# Patient Record
Sex: Female | Born: 1995 | Race: Black or African American | Hispanic: No | Marital: Single | Smoking: Never smoker
Health system: Southern US, Community
[De-identification: ages and names within clinical notes are randomized; demographics above are authoritative.]

---
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
## 2013-04-19 ENCOUNTER — Encounter (HOSPITAL_COMMUNITY): Payer: Self-pay | Admitting: Emergency Medicine

## 2013-04-19 ENCOUNTER — Emergency Department (HOSPITAL_COMMUNITY)
Admission: EM | Admit: 2013-04-19 | Discharge: 2013-04-19 | Disposition: A | Discharge status: Home / Self Care | Payer: BC Managed Care – PPO | Attending: Emergency Medicine | Admitting: Emergency Medicine

## 2013-04-19 ENCOUNTER — Emergency Department (HOSPITAL_COMMUNITY): Payer: BC Managed Care – PPO

## 2013-04-19 DIAGNOSIS — Y939 Activity, unspecified: Secondary | ICD-10-CM | POA: Insufficient documentation

## 2013-04-19 DIAGNOSIS — Y929 Unspecified place or not applicable: Secondary | ICD-10-CM | POA: Insufficient documentation

## 2013-04-19 DIAGNOSIS — S93409A Sprain of unspecified ligament of unspecified ankle, initial encounter: Secondary | ICD-10-CM | POA: Insufficient documentation

## 2013-04-19 DIAGNOSIS — X58XXXA Exposure to other specified factors, initial encounter: Secondary | ICD-10-CM | POA: Insufficient documentation

## 2013-04-19 MED ORDER — IBUPROFEN 600 MG PO TABS: 600.0000 mg | ORAL_TABLET | Freq: Four times a day (QID) | ORAL | Status: DC | PRN

## 2013-04-19 NOTE — Discharge Instructions (Signed)
Please call your doctor for a followup appointment within 24-48 hours. When you talk to your doctor please let them know that you were seen in the emergency department and have them acquire all of your records so that they can discuss the findings with you and formulate a treatment plan to fully care for your new and ongoing problems. Please call and set-up appointment with health and wellness Center to be reassessed Please call and set up an appointment with orthopedics regarding ongoing ankle pain Please keep ankle in brace at all times Please rest, ice, elevate Please avoid any physical or shortness activity Please continue to monitor symptoms closely and if symptoms are to worsen or change (fever greater than 101, chills, nausea, vomiting, chest pain, shortness of breath, difficulty breathing, fall, injury) please report back to the ED immediately   Ankle Sprain An ankle sprain is an injury to the strong, fibrous tissues (ligaments) that hold the bones of your ankle joint together.  CAUSES An ankle sprain is usually caused by a fall or by twisting your ankle. Ankle sprains most commonly occur when you step on the outer edge of your foot, and your ankle turns inward. People who participate in sports are more prone to these types of injuries.  SYMPTOMS   Pain in your ankle. The pain may be present at rest or only when you are trying to stand or walk.  Swelling.  Bruising. Bruising may develop immediately or within 1 to 2 days after your injury.  Difficulty standing or walking, particularly when turning corners or changing directions. DIAGNOSIS  Your caregiver will ask you details about your injury and perform a physical exam of your ankle to determine if you have an ankle sprain. During the physical exam, your caregiver will press on and apply pressure to specific areas of your foot and ankle. Your caregiver will try to move your ankle in certain ways. An X-ray exam may be done to be sure a  bone was not broken or a ligament did not separate from one of the bones in your ankle (avulsion fracture).  TREATMENT  Certain types of braces can help stabilize your ankle. Your caregiver can make a recommendation for this. Your caregiver may recommend the use of medicine for pain. If your sprain is severe, your caregiver may refer you to a surgeon who helps to restore function to parts of your skeletal system (orthopedist) or a physical therapist. HOME CARE INSTRUCTIONS   Apply ice to your injury for 1 2 days or as directed by your caregiver. Applying ice helps to reduce inflammation and pain.  Put ice in a plastic bag.  Place a towel between your skin and the bag.  Leave the ice on for 15-20 minutes at a time, every 2 hours while you are awake.  Only take over-the-counter or prescription medicines for pain, discomfort, or fever as directed by your caregiver.  Elevate your injured ankle above the level of your heart as much as possible for 2 3 days.  If your caregiver recommends crutches, use them as instructed. Gradually put weight on the affected ankle. Continue to use crutches or a cane until you can walk without feeling pain in your ankle.  If you have a plaster splint, wear the splint as directed by your caregiver. Do not rest it on anything harder than a pillow for the first 24 hours. Do not put weight on it. Do not get it wet. You may take it off to take a  shower or bath.  You may have been given an elastic bandage to wear around your ankle to provide support. If the elastic bandage is too tight (you have numbness or tingling in your foot or your foot becomes cold and blue), adjust the bandage to make it comfortable.  If you have an air splint, you may blow more air into it or let air out to make it more comfortable. You may take your splint off at night and before taking a shower or bath. Wiggle your toes in the splint several times per day to decrease swelling. SEEK MEDICAL CARE  IF:   You have rapidly increasing bruising or swelling.  Your toes feel extremely cold or you lose feeling in your foot.  Your pain is not relieved with medicine. SEEK IMMEDIATE MEDICAL CARE IF:  Your toes are numb or blue.  You have severe pain that is increasing. MAKE SURE YOU:   Understand these instructions.  Will watch your condition.  Will get help right away if you are not doing well or get worse. Document Released: 01/15/2005 Document Revised: 10/10/2011 Document Reviewed: 01/27/2011 [facility] Patient Information 2014 [place], [place].   Emergency Department Resource Guide 1) Find a Doctor and Pay Out of Pocket Although you won't have to find out who is covered by your insurance plan, it is a good idea to ask around and get recommendations. You will then need to call the office and see if the doctor you have chosen will accept you as a new patient and what types of options they offer for patients who are self-pay. Some doctors offer discounts or will set up payment plans for their patients who do not have insurance, but you will need to ask so you aren't surprised when you get to your appointment.  2) Contact Your Local Health Department Not all health departments have doctors that can see patients for sick visits, but many do, so it is worth a call to see if yours does. If you don't know where your local health department is, you can check in your phone book. The CDC also has a tool to help you locate your state's health department, and many state websites also have listings of all of their local health departments.  3) Find a Walk-in Clinic If your illness is not likely to be very severe or complicated, you may want to try a walk in clinic. These are popping up all over the country in pharmacies, drugstores, and shopping centers. They're usually staffed by nurse practitioners or physician assistants that have been trained to treat common illnesses and complaints. They're  usually fairly quick and inexpensive. However, if you have serious medical issues or chronic medical problems, these are probably not your best option.  No Primary Care Doctor: - Call Health Connect at  [phone] - they can help you locate a primary care doctor that  accepts your insurance, provides certain services, etc. - Physician Referral Service- [phone]  Chronic Pain Problems: Organization         Address  Phone   Notes  [facility]  [phone] Patients need to be referred by their primary care doctor.   Medication Assistance: Organization         Address  Phone   Notes  [facility] Medication [facility] [address] [place] [place], [place] [address] [phone] --Must be a resident of [facility] -- Must have NO insurance coverage whatsoever (no Medicaid/ Medicare, etc.) --  The pt. MUST have a primary care doctor that directs their care regularly and follows them in the community   [facility]ist  [phone]   [employer]  [phone]    Agencies that provide inexpensive medical care: Organization         Address  Phone   Notes  [place]  [phone]   [facility]    [phone]   [facility] [place], [place] [address] [phone]   [facility] [address] [place] [phone]   Planned Parenthood    [phone]   [facility]    [phone]   [place] [address], [place] Phone:  [phone], Fax:  [phone] Hours of Operation:  9 am - 6 pm, M-F.  Also accepts Medicaid/Medicare and self-pay.  [facility] for [place] [place], [address], [place] Phone: [phone], Fax: [phone]. Hours of Operation:  8:30 am - 5:30 pm, M-F.  Also accepts Medicaid and self-pay.  [facility] [place] [address] [place] Phone:  [phone]   [place], [place], [place] [phone], Ext. [phone] Mondays & Thursdays: 7-9 AM.  First 15 patients are seen on a first come, first serve basis.    [place] Providers:  Organization         Address  Phone   Notes  [facility] [address], [place] [phone] Also accepts self-pay patients.  [facility] [id number] [place], [place]  [phone]   [place], [address], [place] [phone]   [facility] [address], [place] [phone]   Lucianne Lei [address], [place]   [phone] Only accepts [place] patients after they have their name applied to their card.   Self-Pay (no insurance) in [facility]:  Organization         Address  Phone   Notes  Sickle Cell Patients, [facility] [place] [phone]   [facility] [place] [phone]   [facility] [place]  [place], [place], [place] [phone]   [facility]/Dr. Osei-Bonsu  [address] [place] or [address], [place] [phone] Phone number for both [place] and [place] locations is the same.  [facility] [address], [place] [phone]   [facility] [address] [place] or [address] [phone]   [facility] [address] [place] [phone], phone; [phone], fax Sees patients 1st and 3rd Saturday of every month.  Must not qualify for public or private insurance (i.e. Medicaid, Medicare, [place] Health Choice, Veterans' Benefits)  Household income should be no more than 200% of the poverty level The clinic cannot treat  you if you are pregnant or think you are pregnant  Sexually transmitted diseases are not treated at the clinic.    Dental Care: Organization         Address  Phone  Notes  [facility] [phone]  [address] [place], [place] [phone] Accepts children up to age 3 who are enrolled in [place] or [place] Health Choice; pregnant women with a Medicaid card; and children who have applied for Medicaid or [place] Health Choice, but were declined, whose parents can pay a reduced fee at time of service.  [facility] Department of [facility]  [address], [place] [phone] Accepts children up to age 100 who are enrolled in [place] or [place] Health Choice; pregnant women with a Medicaid card; and children who have applied for Medicaid or [place] Health Choice, but were declined, whose parents can pay a reduced fee at time of service.  [facility]  [address] [place], [place] [phone] Patients are seen by appointment only. Walk-ins are not accepted. [facility] will see patients 44 years of age and older. Monday - Tuesday (8am-5pm) Most Wednesdays (8:30-5pm) $30 per visit, cash only  [facility] Adult [facility] Access [facility]  [address], [facility] [phone] Patients are seen by appointment only. Walk-ins are not accepted. [facility] will see patients 80 years of age and older. One Wednesday Evening (Monthly: Volunteer Based).  $30 per visit, cash only  [employer]  [phone] for adults; Children under age 10, call Graduate Pediatric Dentistry at [phone]. Children aged 55-14, please call [phone] to request a pediatric application.  Dental services are provided in all areas of dental care including fillings, crowns and bridges, complete and partial dentures, implants, gum treatment, root canals, and extractions. Preventive care is also provided. Treatment  is provided to both adults and children. Patients are selected via a lottery and there is often a waiting list.   [facility] [address] [place]  [phone] [email or web address]   [facility] [address] [place], [place] [phone], Ext. [phone] Second and Fourth Thursday of each month, opens at 6:30 AM; Clinic ends at 9 AM.  Patients are seen on a first-come first-served basis, and a limited number are seen during each clinic.   [facility]  [address] [place], [place] [phone]   Eligibility Requirements You must have lived in [place], [place], or [place] counties for at least the last three months.   You cannot be eligible for state or federal sponsored [place], including [employer], [place], or [employer].   You generally cannot be eligible for healthcare insurance through your employer.    How to apply: Eligibility screenings are held every Tuesday and Wednesday afternoon from 1:00 pm until 4:00 pm. You do not need an appointment for the interview!  [facility] [address] [place], [place] [phone]   [facility] Health Department  [phone]   [facility] Health Department  [phone]   [facility] Health Department  [phone]    Behavioral Health Resources in the Community: Intensive Outpatient Programs Organization         Address  Phone  Notes  [facility] [address], [place], [place] [phone]   [facility] Outpatient [address] [place], [place] [phone]   [facility] [address] [place], [place]  [phone]   [facility] Mental Health [address],  [place], [place] [phone] or [phone]   Substance Abuse Resources Organization         Address  Phone  Notes  Alcohol and  Drug Services  [phone]   [facility]  [phone]   [facility]  [facility]  [phone]   [facility]  [phone]   Residential & Outpatient Substance Abuse Program  [phone]   Psychological Services Organization         Address  Phone  Notes  [facility]  [phone]- [phone]   [facility]  [phone]- [phone]   [facility] [address], [place] [phone] or [phone]    Mobile Crisis Teams Organization         Address  Phone  Notes  [facility], Mobile Crisis Care Unit  [phone]   [facility]  [address] [place], [place] [phone]   Doristine LocksSharon DeEsch [address] [place] [phone]    Self-Help/Support Groups Organization         Address  Phone             Notes  [facility] [place] - variety of support groups  336- [phone] Call for more information  Narcotics Anonymous (NA), [facility] [address], [place]  2 meetings at this location   Statisticianesidential Treatment Programs Organization         Address  Phone  Notes  [facility] [address],    [place]  [phone]   [facility]  [address], [place] [phone], [place], [place] [phone]   [facility] [address], [place] [phone] Admissions: 8am-3pm M-F  [facility] [address]    [place], [place] [phone]   [facility] [address], [place], [place] [phone]   [facility] [address]  [place], [place] [phone]   [facility] - Intensive Outpatient [address], [place], [place] [phone]   [facility] ([facility] [address]  [place], [place] [phone] or [phone]   [facility] ([facility]) [address] [place], [place] [phone] Accepts Medicaid  [facility] [address]  [place] [phone] Substance Abuse/Addiction Treatment     [facility] Behavioral Health Resources Organization         Address  Phone  Notes  [facility]  [phone](888) [phone]   Angie FavaJulie Brannon, PhD [address], [address] [place], [place]   [phone] or [phone](336) [phone]   [facility]   [address] [place], [place] [phone]   [facility] [address], [place], [place] [phone]) [phone] Insurance/Medicaid/sponsorship through [facility]  [facility] [address]                                    [place], [place] [phone] Therapy/tele-psych/case  [facility] [address].   [place], [place] [phone](336) [phone]    Dr. Lolly MustacheArfeen  [phone]) [phone]   [facility]  [employer] [facility] 1) [address], [place] 2) [address], [place] 3)  [address], [place] [phone](336) [phone]  [phone]   [facility] Child Abuse Hotline [phone]) [phone] or [phone](336) [phone] (After Hours)

## 2013-04-19 NOTE — ED Notes (Signed)
She c/o significant pain in left ankle while walking this past Thurs.--this pain persists.  She further tells me she suffered a left ankle fracture some 5 years ago from which she recovered sufficiently to resume normal activity.  She is in no distress.

## 2013-04-19 NOTE — ED Provider Notes (Signed)
CSN: [id number]     Arrival date & time 04/19/13  1211 History  This chart was scribed for non-physician practitioner, Raymon Mutton, PA-C,working with Laray Anger, DO, by Karle Plumber, ED Scribe.  This patient was seen in room WTR7/WTR7 and the patient's care was started at 2:02 PM.  Chief Complaint  Patient presents with  . Ankle Pain   The history is provided by the patient. No language interpreter was used.   HPI Comments:  Yvette Rhodes is a 18 y.o. female who presents to the Emergency Department complaining of sharp pain in the posterior left ankle that started three days ago. She reports associated mild swelling. Pt states the pain is constant but worsens with walking. She states she fractured the ankle approximately six years ago. She reports that the ankle feels weak. She reports wearing an ankle brace. Pt denies taking anything for the pain. She denies warmth to the touch, numbness or tingling of the ankle. Pt denies hearing a popping sound from the ankle. She has been ambulatory since the pain started. Pt states she does have a PCP. She reports seeing an orthopedist at [facility] for her last ankle injury.   History reviewed. No pertinent past medical history. No past surgical history on file. No family history on file. History  Substance Use Topics  . Smoking status: Never Smoker   . Smokeless tobacco: Never Used  . Alcohol Use: No   OB History   Grav Para Term Preterm Abortions TAB SAB Ect Mult Living                 Review of Systems  Musculoskeletal: Positive for arthralgias (left ankle) and joint swelling (left ankle).  Skin: Negative for color change.  Neurological: Negative for numbness.  All other systems reviewed and are negative.   Allergies  Review of patient's allergies indicates no known allergies.  Home Medications   Current Outpatient Rx  Name  Route  Sig  Dispense  Refill  . cyclobenzaprine (FLEXERIL) 10 MG tablet    Oral   Take 1 tablet (10 mg total) by mouth 2 (two) times daily as needed for muscle spasms.   20 tablet   0   . ibuprofen (ADVIL,MOTRIN) 800 MG tablet   Oral   Take 800 mg by mouth every 8 (eight) hours as needed for pain.          Marland Kitchen ibuprofen (ADVIL,MOTRIN) 800 MG tablet   Oral   Take 1 tablet (800 mg total) by mouth 3 (three) times daily.   30 tablet   0    Triage Vitals: BP 120/80  Pulse 87  Temp(Src) 98.3 F (36.8 C) (Oral)  Resp 16  SpO2 100%  LMP 03/28/2013 Physical Exam  Nursing note and vitals reviewed. Constitutional: She is oriented to person, place, and time. She appears well-developed and well-nourished.  HENT:  Head: Normocephalic and atraumatic.  Eyes: Conjunctivae and EOM are normal. Right eye exhibits no discharge. Left eye exhibits no discharge.  Neck: Normal range of motion. Neck supple.  Cardiovascular: Normal rate, regular rhythm and normal heart sounds.  Exam reveals no friction rub.   No murmur heard. Pulmonary/Chest: Effort normal and breath sounds normal. No respiratory distress. She has no wheezes. She has no rales.  Musculoskeletal: Normal range of motion. She exhibits tenderness.       Feet:  Negative swelling, erythema, inflammation, lesions, sores, deformities noted to the left ankle or foot. Discomfort upon palpation to the  posterior aspect at Achilles region. Full range of motion to the left ankle and digits of the left foot without difficulty. Negative pain upon palpation to the foot or digits. Full range of motion to the digits. Negative Thompson sign.  Neurological: She is alert and oriented to person, place, and time. No cranial nerve deficit. She exhibits normal muscle tone. Coordination normal.  Cranial nerves III-XII grossly intact Strength 5+/5+ to upper and lower extremities bilaterally with resistance applied, equal distribution noted Strength intact to the digits of the left foot Sensation intact with differentiation to sharp and  dull touch Gait proper, proper balance - negative sway, negative drift, negative step-offs  Skin: Skin is warm and dry.  Psychiatric: She has a normal mood and affect. Her behavior is normal.    ED Course  Procedures (including critical care time) DIAGNOSTIC STUDIES: Oxygen Saturation is 100% on RA, normal by my interpretation.   COORDINATION OF CARE: 2:07 PM- Will X-Ray left ankle. Pt verbalizes understanding and agrees to plan.  Medications - No data to display  Labs Review Labs Reviewed - No data to display Imaging Review Dg Ankle Complete Left  04/19/2013   CLINICAL DATA:  Posterior left ankle pain, history of fracture 5 years ago  EXAM: LEFT ANKLE COMPLETE - 3+ VIEW  COMPARISON:  CT left lower extremity dated 03/22/2006  FINDINGS: No fracture or dislocation is seen. No residual post-traumatic deformity is present.  The ankle mortise is intact.  The base of the fifth metatarsal is unremarkable.  The visualized soft tissues are unremarkable.  IMPRESSION: Normal ankle radiographs.  No residual post-traumatic deformity is present.   Electronically Signed   By: Charline Bills M.D.   On: 04/19/2013 14:39     EKG Interpretation None      MDM   Final diagnoses:  Ankle sprain    Filed Vitals:   04/19/13 1302  BP: 120/80  Pulse: 87  Temp: 98.3 F (36.8 C)  TempSrc: Oral  Resp: 16  SpO2: 100%    I personally performed the services described in this documentation, which was scribed in my presence. The recorded information has been reviewed and is accurate.  Patient presenting to the ED with left ankle pain does been ongoing since Thursday described as a sharp discomfort worse when patient walks localize the posterior aspect of her left ankle. Stated that she fractured ankle approximately 6 years ago. Stated that she feels a weakness in her left ankle. Denied new or recent injury. Denied numbness, tingling, loss of sensation. Alert and oriented. GCS 15. Heart rate and  rhythm normal. Lungs clear to auscultation to upper and lobes bilaterally. Radial and DP pulses 2+ bilaterally. Negative swelling, erythema, warmth upon palpation, deformities noted to the left ankle or foot. Discomfort upon palpation to the posterior aspect, Achilles tendon region. Full range of motion to the left ankle without difficulty and full range of motion to digits the left foot without difficulty. Full dorsiflexion, plantar flexion, inversion and eversion noted. Strength intact to digits of the left foot. Sensation intact with differentiation sharp and dull touch. Negative Thompson sign. Left ankle plain film negative for acute osseous injury-no residual posterior meds deformity identified. Doubt septic joint. Doubt Achilles tendon rupture. Suspicion to be ankle sprain. Patient stable, afebrile. Patient neurovascularly intact. Discharged patient. Patient placed in ASO brace for comfort. Discussed with patient to rest, ice, elevate. Discussed with patient to avoid physical strenuous activity. Referred patient to primary care provider and orthopedics. Discussed with patient  to closely monitor symptoms and if symptoms are to worsen or change to report back to the ED - strict return instructions given.  Patient agreed to plan of care, understood, all questions answered.   Raymon MuttonMarissa Jebediah Macrae, PA-C 04/19/13 2136

## 2013-04-20 NOTE — ED Provider Notes (Signed)
Medical screening examination/treatment/procedure(s) were performed by non-physician practitioner and as supervising physician I was immediately available for consultation/collaboration.   EKG Interpretation None        [name], DO 04/20/13 2123 

## 2014-04-16 ENCOUNTER — Ambulatory Visit (INDEPENDENT_AMBULATORY_CARE_PROVIDER_SITE_OTHER): Payer: 59 | Admitting: Women's Health

## 2014-04-16 ENCOUNTER — Encounter: Payer: Self-pay | Admitting: Women's Health

## 2014-04-16 VITALS — BP 124/86 | Ht 61.0 in | Wt 127.0 lb

## 2014-04-16 DIAGNOSIS — Z30011 Encounter for initial prescription of contraceptive pills: Secondary | ICD-10-CM

## 2014-04-16 DIAGNOSIS — Z01419 Encounter for gynecological examination (general) (routine) without abnormal findings: Secondary | ICD-10-CM

## 2014-04-16 MED ORDER — NORGESTIMATE-ETH ESTRADIOL 0.25-35 MG-MCG PO TABS: 1.0000 | ORAL_TABLET | Freq: Every day | ORAL | Status: DC

## 2014-04-16 NOTE — Progress Notes (Signed)
Yvette Rhodes Aug 19, 1995 [id number]    History:    Presents for annual exam.  Regular monthly 3 day cycle/ condoms, not currently active. Gardasil series completed. Negative STD screen and normal labs for basic training. 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  Past medical history, past surgical history, family history and social history were all reviewed and documented in the EPIC chart. Student at [employer], physical therapy major, doing well. Mother breast cancer survivor, diagnosed age 50, negative BRCA.  ROS:  A ROS was performed and pertinent positives and negatives are included.  Exam:  Filed Vitals:   04/16/14 1536  BP: 124/86    General appearance:  Normal Thyroid:  Symmetrical, normal in size, without palpable masses or nodularity. Respiratory  Auscultation:  Clear without wheezing or rhonchi Cardiovascular  Auscultation:  Regular rate, without rubs, murmurs or gallops  Edema/varicosities:  Not grossly evident Abdominal  Soft,nontender, without masses, guarding or rebound.  Liver/spleen:  No organomegaly noted  Hernia:  None appreciated  Skin  Inspection:  Grossly normal   Breasts: Examined lying and sitting.     Right: Without masses, retractions, discharge or axillary adenopathy.     Left: Without masses, retractions, discharge or axillary adenopathy. Gentitourinary   Inguinal/mons:  Normal without inguinal adenopathy  External genitalia:  Normal  BUS/Urethra/Skene's glands:  Normal  Vagina:  Normal  Cervix:  Normal  Uterus:   normal in size, shape and contour.  Midline and mobile  Adnexa/parametria:     Rt: Without masses or tenderness.   Lt: Without masses or tenderness.  Anus and perineum: Normal   Assessment/Plan:  19 y.o. 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  Contraception management  Plan: Contraception options  reviewed, prefers pills, Sprintec prescription, proper use given and reviewed slight risk for blood clots and strokes. Will start first day of next cycle take continuously, stopped for 5 days with bleeding. Call if problems with cycle control. SBE's, regular exercise, calcium rich diet, MVI daily encouraged. UA only, Pap screening guidelines reviewed. Condoms encouraged if sexually active. Campus safety reviewed. Will start screening baseline mammograms at 28.  Huel Cote [facility], 4:49 PM 04/16/2014

## 2014-04-16 NOTE — Patient Instructions (Signed)

## 2014-05-12 ENCOUNTER — Other Ambulatory Visit: Payer: Self-pay | Admitting: Women's Health

## 2014-05-12 DIAGNOSIS — Z30011 Encounter for initial prescription of contraceptive pills: Secondary | ICD-10-CM

## 2014-05-12 MED ORDER — NORGESTIMATE-ETH ESTRADIOL 0.25-35 MG-MCG PO TABS: 1.0000 | ORAL_TABLET | Freq: Every day | ORAL | Status: DC

## 2014-11-02 DIAGNOSIS — M79605 Pain in left leg: Secondary | ICD-10-CM | POA: Insufficient documentation

## 2014-12-01 ENCOUNTER — Telehealth: Payer: Self-pay | Admitting: *Deleted

## 2014-12-01 NOTE — Telephone Encounter (Signed)
Pt called and left message in triage voicemail c/o bleeding while on birth control pills since March asked for advice what to do. I called patient back and left on her voicemail to make OV to be seen.

## 2014-12-07 ENCOUNTER — Ambulatory Visit (INDEPENDENT_AMBULATORY_CARE_PROVIDER_SITE_OTHER): Payer: 59 | Admitting: Women's Health

## 2014-12-07 ENCOUNTER — Telehealth: Payer: Self-pay | Admitting: *Deleted

## 2014-12-07 ENCOUNTER — Encounter: Payer: Self-pay | Admitting: Women's Health

## 2014-12-07 VITALS — BP 132/80 | Ht 61.0 in | Wt 127.0 lb

## 2014-12-07 DIAGNOSIS — B9689 Other specified bacterial agents as the cause of diseases classified elsewhere: Secondary | ICD-10-CM

## 2014-12-07 DIAGNOSIS — A499 Bacterial infection, unspecified: Secondary | ICD-10-CM | POA: Diagnosis not present

## 2014-12-07 DIAGNOSIS — N760 Acute vaginitis: Principal | ICD-10-CM

## 2014-12-07 DIAGNOSIS — N926 Irregular menstruation, unspecified: Secondary | ICD-10-CM

## 2014-12-07 LAB — WET PREP FOR TRICH, YEAST, CLUE
TRICH WET PREP: NONE SEEN
WBC, Wet Prep HPF POC: NONE SEEN
YEAST WET PREP: NONE SEEN

## 2014-12-07 MED ORDER — METRONIDAZOLE 500 MG PO TABS: 500.0000 mg | ORAL_TABLET | Freq: Two times a day (BID) | ORAL | Status: DC

## 2014-12-07 NOTE — Progress Notes (Signed)
Patient ID: Yvette Rhodes, female   DOB: [birth year], 19 y.o.   MRN: [id number] Presents with complaint of irregular menstrual bleeding for 1 month. Started on Sprintec in May, taking continuously to avoid menses. Condoms/Same partner. Occasional vaginal odor, denies vaginal itching, urinary symptoms, abdominal pain or fever.   Exam: Appears well. External genitalia within normal limits, speculum exam minimal menses type blood, odor noted, wet prep positive for moderate clues, TNTC bacteria. Bimanual uterus was small, nontender, no fullness in adnexa. GC/Chlamydia culture taken.   Irregular bleeding on continuous OCs Bacteria vaginosis  Plan: Stop OCs for 5 days have cycle and then resume taking. Reviewed mechanism of OCs. Take continuously until spotting/bleeding stopped for 5 days have cycle and then resume. Continue condoms. Flagyl 500 twice daily for 7 days, alcohol precautions reviewed. Instructed to call if continued spotting or irregular bleeding on OCs.

## 2014-12-07 NOTE — Patient Instructions (Signed)

## 2014-12-07 NOTE — Telephone Encounter (Signed)
Rx today for flagyl 500 mg was sent to wrong pharmacy, pt asked if be sent to [facility] in [place]

## 2014-12-09 LAB — GC/CHLAMYDIA PROBE AMP
CT PROBE, AMP APTIMA: NEGATIVE
GC PROBE AMP APTIMA: NEGATIVE

## 2015-04-29 ENCOUNTER — Encounter: Payer: 59 | Admitting: Women's Health

## 2016-05-02 ENCOUNTER — Encounter: Payer: Self-pay | Admitting: Women's Health

## 2016-05-02 ENCOUNTER — Ambulatory Visit (INDEPENDENT_AMBULATORY_CARE_PROVIDER_SITE_OTHER): Payer: 59 | Admitting: Women's Health

## 2016-05-02 VITALS — BP 112/70

## 2016-05-02 DIAGNOSIS — B373 Candidiasis of vulva and vagina: Secondary | ICD-10-CM

## 2016-05-02 DIAGNOSIS — B9689 Other specified bacterial agents as the cause of diseases classified elsewhere: Secondary | ICD-10-CM

## 2016-05-02 DIAGNOSIS — N760 Acute vaginitis: Secondary | ICD-10-CM

## 2016-05-02 DIAGNOSIS — B3731 Acute candidiasis of vulva and vagina: Secondary | ICD-10-CM

## 2016-05-02 LAB — WET PREP FOR TRICH, YEAST, CLUE: TRICH WET PREP: NONE SEEN

## 2016-05-02 MED ORDER — FLUCONAZOLE 150 MG PO TABS: 150.0000 mg | ORAL_TABLET | Freq: Once | ORAL | 1 refills | Status: DC

## 2016-05-02 MED ORDER — METRONIDAZOLE 500 MG PO TABS: 500.0000 mg | ORAL_TABLET | Freq: Two times a day (BID) | ORAL | 0 refills | Status: DC

## 2016-05-02 MED ORDER — FLUCONAZOLE 150 MG PO TABS: 150.0000 mg | ORAL_TABLET | Freq: Once | ORAL | 1 refills | Status: AC

## 2016-05-02 NOTE — Patient Instructions (Signed)
Vaginal Yeast infection, Adult Vaginal yeast infection is a condition that causes soreness, swelling, and redness (inflammation) of the vagina. It also causes vaginal discharge. This is a common condition. Some women get this infection frequently. What are the causes? This condition is caused by a change in the normal balance of the yeast (candida) and bacteria that live in the vagina. This change causes an overgrowth of yeast, which causes the inflammation. What increases the risk? This condition is more likely to develop in:  Women who take antibiotic medicines.  Women who have diabetes.  Women who take birth control pills.  Women who are pregnant.  Women who douche often.  Women who have a weak defense (immune) system.  Women who have been taking steroid medicines for a long time.  Women who frequently wear tight clothing. What are the signs or symptoms? Symptoms of this condition include:  White, thick vaginal discharge.  Swelling, itching, redness, and irritation of the vagina. The lips of the vagina (vulva) may be affected as well.  Pain or a burning feeling while urinating.  Pain during sex. How is this diagnosed? This condition is diagnosed with a medical history and physical exam. This will include a pelvic exam. Your health care provider will examine a sample of your vaginal discharge under a microscope. Your health care provider may send this sample for testing to confirm the diagnosis. How is this treated? This condition is treated with medicine. Medicines may be over-the-counter or prescription. You may be told to use one or more of the following:  Medicine that is taken orally.  Medicine that is applied as a cream.  Medicine that is inserted directly into the vagina (suppository). Follow these instructions at home:  Take or apply over-the-counter and prescription medicines only as told by your health care provider.  Do not have sex until your health care  provider has approved. Tell your sex partner that you have a yeast infection. That person should go to his or her health care provider if he or she develops symptoms.  Do not wear tight clothes, such as pantyhose or tight pants.  Avoid using tampons until your health care provider approves.  Eat more yogurt. This may help to keep your yeast infection from returning.  Try taking a sitz bath to help with discomfort. This is a warm water bath that is taken while you are sitting down. The water should only come up to your hips and should cover your buttocks. Do this 3-4 times per day or as told by your health care provider.  Do not douche.  Wear breathable, cotton underwear.  If you have diabetes, keep your blood sugar levels under control. Contact a health care provider if:  You have a fever.  Your symptoms go away and then return.  Your symptoms do not get better with treatment.  Your symptoms get worse.  You have new symptoms.  You develop blisters in or around your vagina.  You have blood coming from your vagina and it is not your menstrual period.  You develop pain in your abdomen. This information is not intended to replace advice given to you by your health care provider. Make sure you discuss any questions you have with your health care provider. Document Released: 10/25/2004 Document Revised: 06/29/2015 Document Reviewed: 07/19/2014 Elsevier Interactive Patient Education  2017 Elsevier Inc. Bacterial Vaginosis Bacterial vaginosis is an infection of the vagina. It happens when too many germs (bacteria) grow in the vagina. This infection puts   you at risk for infections from sex (STIs). Treating this infection can lower your risk for some STIs. You should also treat this if you are pregnant. It can cause your baby to be born early. Follow these instructions at home: Medicines   Take over-the-counter and prescription medicines only as told by your doctor.  Take or use your  antibiotic medicine as told by your doctor. Do not stop taking or using it even if you start to feel better. General instructions   If you your sexual partner is a woman, tell her that you have this infection. She needs to get treatment if she has symptoms. If you have a female partner, he does not need to be treated.  During treatment:  Avoid sex.  Do not douche.  Avoid alcohol as told.  Avoid breastfeeding as told.  Drink enough fluid to keep your pee (urine) clear or pale yellow.  Keep your vagina and butt (rectum) clean.  Wash the area with warm water every day.  Wipe from front to back after you use the toilet.  Keep all follow-up visits as told by your doctor. This is important. Preventing this condition   Do not douche.  Use only warm water to wash around your vagina.  Use protection when you have sex. This includes:  Latex condoms.  Dental dams.  Limit how many people you have sex with. It is best to only have sex with the same person (be monogamous).  Get tested for STIs. Have your partner get tested.  Wear underwear that is cotton or lined with cotton.  Avoid tight pants and pantyhose. This is most important in summer.  Do not use any products that have nicotine or tobacco in them. These include cigarettes and e-cigarettes. If you need help quitting, ask your doctor.  Do not use illegal drugs.  Limit how much alcohol you drink. Contact a doctor if:  Your symptoms do not get better, even after you are treated.  You have more discharge or pain when you pee (urinate).  You have a fever.  You have pain in your belly (abdomen).  You have pain with sex.  Your bleed from your vagina between periods. Summary  This infection happens when too many germs (bacteria) grow in the vagina.  Treating this condition can lower your risk for some infections from sex (STIs).  You should also treat this if you are pregnant. It can cause early (premature)  birth.  Do not stop taking or using your antibiotic medicine even if you start to feel better. This information is not intended to replace advice given to you by your health care provider. Make sure you discuss any questions you have with your health care provider. Document Released: 10/25/2007 Document Revised: 10/01/2015 Document Reviewed: 10/01/2015 Elsevier Interactive Patient Education  2017 Elsevier Inc.  

## 2016-05-02 NOTE — Progress Notes (Signed)
HPI: Presents with cramping around normal time of menustration as well as intermittent cramping. 1/10 severity. On first pack of Yaz just began placebo pill. Denies urinary symptoms, fever Sexually active with same partner 3 years. STD testing offered and denied. Spironolactone 100 mg for acne per dermotologist but stopped taking due to dizziness and BP decrease.   Exam: Appears well, in no acute distress. External genitalia within normal limits, no visible erythema, Speculum exam noted for white discharge, mild odor. Pelvic exam normal with no adnexal discomfort. No CVA tenderness. Wet prep: Many yeast, moderate clue cells, TNTC wbc's, many bacteria  Bacterial Vaginosis Candidiasis  Plan: Diflucan  1 tablet, one time. Flagyl 500 mg twice a day for 7 days, alcohol prevention reviewed. Decrease Spironolactone to 1/2 tablet,  for acne. Explained cramping likely due to lack of menstuation/bacterial infection. Discussed prevention for yeast infections such as frequent underwear change and sleeping without underwear. Encouraged continual OCP and condom use. Call office if no improvement.

## 2016-06-13 ENCOUNTER — Encounter: Payer: Self-pay | Admitting: Gynecology

## 2017-05-06 ENCOUNTER — Encounter: Payer: 59 | Admitting: Women's Health

## 2017-07-16 ENCOUNTER — Encounter: Payer: Self-pay | Admitting: Women's Health

## 2017-07-16 ENCOUNTER — Ambulatory Visit: Payer: Managed Care, Other (non HMO) | Admitting: Women's Health

## 2017-07-16 VITALS — BP 118/78 | Ht 61.0 in | Wt 124.0 lb

## 2017-07-16 DIAGNOSIS — Z01419 Encounter for gynecological examination (general) (routine) without abnormal findings: Secondary | ICD-10-CM

## 2017-07-16 MED ORDER — DROSPIRENONE-ETHINYL ESTRADIOL 3-0.02 MG PO TABS: 1.0000 | ORAL_TABLET | Freq: Every day | ORAL | 4 refills | Status: DC

## 2017-07-16 NOTE — Patient Instructions (Signed)

## 2017-07-16 NOTE — Progress Notes (Signed)
Yvette Rhodes [birth year] [id number]    History:    Presents for annual exam. Regular monthly 3 day cycles with migraines. Sprintec stopped due to daily menstrual bleeding. Sexually active with same partner with no contraceptive use. LMP 07/14/2017. Gardasil series completed.  Reports 1 normal Pap done elsewhere.  Past medical history, past surgical history, family history and social history were all reviewed and documented in the EPIC chart.Currently works at [employer] in [place]. Exercise/sports undergraduate degree from [employer]. Intends to become a physical therapists. Recently discharged from [employer] after stress fracture injury. 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 Same partner for 5 years. Mother breast cancer survivor diagnosed at age 24 with indeterminate BRCA.  ROS:  A ROS was performed and pertinent positives and negatives are included.   Exam:  Vitals:   07/16/17 1608  BP: 118/78  Weight: 124 lb (56.2 kg)  Height: '5\' 1"'$  (1.549 m)   Body mass index is 23.43 kg/m.   General appearance:  Normal Thyroid:  Symmetrical, normal in size, without palpable masses or nodularity. Respiratory  Auscultation:  Clear without wheezing or rhonchi Cardiovascular  Auscultation:  Regular rate, without rubs, murmurs or gallops  Edema/varicosities:  Not grossly evident Abdominal  Soft,nontender, without masses, guarding or rebound.  Liver/spleen:  No organomegaly noted  Hernia:  None appreciated  Skin  Inspection:  Grossly normal   Breasts: Examined lying and sitting.     Right: Without masses, retractions, discharge or axillary adenopathy.     Left: Without masses, retractions, discharge or axillary adenopathy. Gentitourinary   Inguinal/mons:  Normal without inguinal adenopathy  External genitalia:  Normal  BUS/Urethra/Skene's glands:  Normal  Vagina:  Normal, menses bleeding  Cervix:  Normal  Uterus:  Normal in size, shape and contour.  Midline and  mobile  Adnexa/parametria:     Rt: Without masses or tenderness.   Lt: Without masses or tenderness.  Anus and perineum: Normal   Assessment/Plan:  22 y.o. SBF G0 for annual exam no complaints.  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 Prescription for Yaz 3-0.02 mg tablet daily. Proper use given and reviewed slight risk for blood clot and strokes. Will start today, placebo days only 4 then start next pack to manage menstrual migraines. Condoms encouraged for first week. Discussed screening mammograms at age 22. Pap 2016 negative. Discussed bacterial vaginosis prevention upon request. CBC . Pap  Yvette Rhodes, 4:51 PM 07/16/2017

## 2017-07-20 LAB — PAP IG, CT-NG, RFX HPV ASCU
CHLAMYDIA, NUC. ACID AMP: NEGATIVE
Gonococcus by Nucleic Acid Amp: NEGATIVE
PAP SMEAR COMMENT: 0

## 2017-07-23 ENCOUNTER — Encounter: Payer: Self-pay | Admitting: Women's Health

## 2017-09-09 ENCOUNTER — Telehealth: Payer: Self-pay | Admitting: *Deleted

## 2017-09-09 MED ORDER — DROSPIRENONE-ETHINYL ESTRADIOL 3-0.02 MG PO TABS: 1.0000 | ORAL_TABLET | Freq: Every day | ORAL | 4 refills | Status: DC

## 2017-09-09 NOTE — Telephone Encounter (Signed)
Patient called stating birth control pills was not at [facility] Pt aware.

## 2018-01-25 ENCOUNTER — Other Ambulatory Visit: Payer: Self-pay

## 2018-01-25 ENCOUNTER — Encounter (HOSPITAL_COMMUNITY): Payer: Self-pay

## 2018-01-25 ENCOUNTER — Emergency Department (HOSPITAL_COMMUNITY)
Admission: EM | Admit: 2018-01-25 | Discharge: 2018-01-26 | Disposition: A | Discharge status: Home / Self Care | Payer: Managed Care, Other (non HMO) | Attending: Emergency Medicine | Admitting: Emergency Medicine

## 2018-01-25 DIAGNOSIS — Y929 Unspecified place or not applicable: Secondary | ICD-10-CM | POA: Diagnosis not present

## 2018-01-25 DIAGNOSIS — Y999 Unspecified external cause status: Secondary | ICD-10-CM | POA: Insufficient documentation

## 2018-01-25 DIAGNOSIS — Y9389 Activity, other specified: Secondary | ICD-10-CM | POA: Insufficient documentation

## 2018-01-25 DIAGNOSIS — S0990XA Unspecified injury of head, initial encounter: Secondary | ICD-10-CM | POA: Diagnosis present

## 2018-01-25 DIAGNOSIS — W208XXA Other cause of strike by thrown, projected or falling object, initial encounter: Secondary | ICD-10-CM | POA: Diagnosis not present

## 2018-01-25 DIAGNOSIS — Z79899 Other long term (current) drug therapy: Secondary | ICD-10-CM | POA: Insufficient documentation

## 2018-01-25 DIAGNOSIS — S060X0A Concussion without loss of consciousness, initial encounter: Secondary | ICD-10-CM | POA: Insufficient documentation

## 2018-01-25 MED ORDER — KETOROLAC TROMETHAMINE 60 MG/2ML IM SOLN
60.0000 mg | Freq: Once | INTRAMUSCULAR | Status: AC
  Administered 2018-01-26: 60 mg via INTRAMUSCULAR
  Filled 2018-01-25: qty 2

## 2018-01-25 NOTE — ED Triage Notes (Signed)
Patient reports that her car trunk hit the top of her head yesterday and is now having headache, worsening pain when reading, and sensitivity to light. Patient denies any N/V or blurred vision

## 2018-01-25 NOTE — ED Provider Notes (Signed)
[facility]-EMERGENCY DEPT Provider Note   CSN: [id number] Arrival date & time: 01/25/18  1606     History   Chief Complaint Chief Complaint  Patient presents with  . Headache    HPI Yvette Rhodes is a 22 y.o. female.  HPI Patient presents to the emergency department with a headache following getting hit in the head with her trunk.  The patient states that she was doing something in her trunk when the trunk fell and hit her in the top of the head.  The patient states that she did not lose consciousness.  She she is has headache and some light sensitivity along with mild nausea.  Patient states that she has no other symptoms at this time.  Patient states she did not take any medications prior to arrival for her symptoms.  Patient denies vomiting, chest pain, shortness of breath, neck pain, dizziness, weakness, numbness, back pain, fever or syncope. History reviewed. No pertinent past medical history.  There are no active problems to display for this patient.   History reviewed. No pertinent surgical history.   OB History    Gravida  0   Para  0   Term  0   Preterm  0   AB  0   Living  0     SAB  0   TAB  0   Ectopic  0   Multiple  0   Live Births               Home Medications    Prior to Admission medications   Medication Sig Start Date End Date Taking? Authorizing Provider  drospirenone-ethinyl estradiol (YAZ,GIANVI,LORYNA) 3-0.02 MG tablet Take 1 tablet by mouth daily. 09/09/17   Harrington ChallengerYoung, Nancy J, NP    Family History Family History  Problem Relation Age of Onset  . Breast cancer Mother   . Hypertension Mother     Social History Social History   Tobacco Use  . Smoking status: Never Smoker  . Smokeless tobacco: Never Used  Substance Use Topics  . Alcohol use: No    Alcohol/week: 0.0 standard drinks  . Drug use: No     Allergies   Patient has no known allergies.   Review of Systems Review of  Systems All other systems negative except as documented in the HPI. All pertinent positives and negatives as reviewed in the HPI.  Physical Exam Updated Vital Signs BP 116/81 (BP Location: Left Arm)   Pulse 82   Temp 98.5 F (36.9 C) (Oral)   Resp 18   Ht 5\' 1"  (1.549 m)   Wt 53.1 kg   LMP 12/26/2017   SpO2 100%   BMI 22.11 kg/m   Physical Exam Vitals signs and nursing note reviewed.  Constitutional:      General: She is not in acute distress.    Appearance: She is well-developed.  HENT:     Head: Normocephalic and atraumatic.  Eyes:     Pupils: Pupils are equal, round, and reactive to light.  Neck:     Musculoskeletal: Normal range of motion and neck supple.  Cardiovascular:     Rate and Rhythm: Normal rate and regular rhythm.     Heart sounds: Normal heart sounds. No murmur. No friction rub. No gallop.   Pulmonary:     Effort: Pulmonary effort is normal. No respiratory distress.     Breath sounds: Normal breath sounds. No wheezing.  Skin:    General: Skin is  warm and dry.     Capillary Refill: Capillary refill takes less than 2 seconds.     Findings: No erythema or rash.  Neurological:     Mental Status: She is alert and oriented to person, place, and time. Mental status is at baseline.     GCS: GCS eye subscore is 4. GCS verbal subscore is 5. GCS motor subscore is 6.     Sensory: No sensory deficit.     Motor: No weakness or abnormal muscle tone.     Coordination: Coordination normal.     Gait: Gait normal.  Psychiatric:        Mood and Affect: Mood normal.        Behavior: Behavior normal.      ED Treatments / Results  Labs (all labs ordered are listed, but only abnormal results are displayed) Labs Reviewed - No data to display  EKG None  Radiology No results found.  Procedures Procedures (including critical care time)  Medications Ordered in ED Medications - No data to display   Initial Impression / Assessment and Plan / ED Course  I have  reviewed the triage vital signs and the nursing notes.  Pertinent labs & imaging results that were available during my care of the patient were reviewed by me and considered in my medical decision making (see chart for details).     Patient and I had a discussion about CT scan versus observing for worsening in her symptoms.  The patient states she does not feel she needs a CT scan at this time.  On examination she has normal strength and sensation in all 4 extremities.  The patient has normal heel-to-shin and finger-nose testing.  Patient has normal gait.  The patient will be given medication for her headache and advised to return for any worsening in her condition.  The patient agrees to this plan and all questions were answered.  The patient states that he does not feel her symptoms necessarily warrant a CT scan at this time.  I did educate the patient on postconcussive symptoms.  Final Clinical Impressions(s) / ED Diagnoses   Final diagnoses:  None    ED Discharge Orders    None       Charlestine NightLawyer, Rose Hegner, PA-C 01/25/18 [id number]    Pricilla LovelessGoldston, Scott, MD 01/26/18 [phone]

## 2018-01-26 MED ORDER — IBUPROFEN 800 MG PO TABS: 800.0000 mg | ORAL_TABLET | Freq: Three times a day (TID) | ORAL | 0 refills | Status: DC | PRN

## 2018-01-26 NOTE — Discharge Instructions (Addendum)
Return here as needed.  Follow-up with primary doctor.  Increase your fluid intake. °

## 2018-02-10 ENCOUNTER — Encounter (HOSPITAL_COMMUNITY): Payer: Self-pay | Admitting: *Deleted

## 2018-02-10 ENCOUNTER — Emergency Department (HOSPITAL_COMMUNITY): Payer: Managed Care, Other (non HMO)

## 2018-02-10 ENCOUNTER — Emergency Department (HOSPITAL_COMMUNITY)
Admission: EM | Admit: 2018-02-10 | Discharge: 2018-02-10 | Disposition: A | Discharge status: Home / Self Care | Payer: Managed Care, Other (non HMO) | Attending: Emergency Medicine | Admitting: Emergency Medicine

## 2018-02-10 DIAGNOSIS — W208XXD Other cause of strike by thrown, projected or falling object, subsequent encounter: Secondary | ICD-10-CM | POA: Insufficient documentation

## 2018-02-10 DIAGNOSIS — R51 Headache: Secondary | ICD-10-CM | POA: Diagnosis present

## 2018-02-10 DIAGNOSIS — Z79899 Other long term (current) drug therapy: Secondary | ICD-10-CM | POA: Diagnosis not present

## 2018-02-10 DIAGNOSIS — S060X0D Concussion without loss of consciousness, subsequent encounter: Secondary | ICD-10-CM | POA: Insufficient documentation

## 2018-02-10 DIAGNOSIS — R519 Headache, unspecified: Secondary | ICD-10-CM

## 2018-02-10 MED ORDER — KETOROLAC TROMETHAMINE 30 MG/ML IJ SOLN
30.0000 mg | Freq: Once | INTRAMUSCULAR | Status: AC
Start: 2018-02-10 — End: 2018-02-10
  Administered 2018-02-10: 30 mg via INTRAMUSCULAR
  Filled 2018-02-10: qty 1

## 2018-02-10 MED ORDER — ACETAMINOPHEN 325 MG PO TABS
650.0000 mg | ORAL_TABLET | Freq: Once | ORAL | Status: AC
Start: 2018-02-10 — End: 2018-02-10
  Administered 2018-02-10: 650 mg via ORAL
  Filled 2018-02-10: qty 2

## 2018-02-10 NOTE — ED Notes (Signed)
ED Provider at bedside. 

## 2018-02-10 NOTE — ED Notes (Signed)
Pt updated on wait for CT, CT states she is next

## 2018-02-10 NOTE — ED Triage Notes (Signed)
Pt in c/o pulsating headache above her left eye, states this has been happening intermittently since a concussion a few weeks ago, denies vision changes, denies n/v

## 2018-02-10 NOTE — Discharge Instructions (Addendum)
Please read attached information. If you experience any new or worsening signs or symptoms please return to the emergency room for evaluation. Please follow-up with your primary care provider or specialist as discussed. Please use medication prescribed only as directed and discontinue taking if you have any concerning signs or symptoms.   °
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
## 2018-02-11 ENCOUNTER — Ambulatory Visit: Payer: Managed Care, Other (non HMO) | Admitting: Neurology

## 2018-02-11 ENCOUNTER — Encounter: Payer: Self-pay | Admitting: Neurology

## 2018-02-11 VITALS — BP 121/78 | HR 94 | Ht 61.0 in | Wt 118.0 lb

## 2018-02-11 DIAGNOSIS — G44319 Acute post-traumatic headache, not intractable: Secondary | ICD-10-CM
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
## 2018-07-21 ENCOUNTER — Encounter: Payer: Managed Care, Other (non HMO) | Admitting: Women's Health

## 2020-01-30 NOTE — L&D Delivery Note (Signed)
Delivery Note   Patient Name: Yvette Rhodes DOB: 06/11/1995 MRN: [id number]  Date of admission: 08/31/2020 Delivering MD:  Dale Renfrow, CNM Date of delivery: 09/02/20 Type of delivery: SVD  Newborn Data: Live born female  Birth Weight:   APGAR:9, 9   Newborn Delivery   Birth date/time: 09/[birth year]/2020 02:09:00 Delivery type: Vaginal, Spontaneous     [name], 25 y.o., @ [redacted]w[redacted]d,  G1P0000, who was admitted for 8/4 for IOL for IUGR, progressed with cytotex x2, pitocin, AROM, gbs-.. I was called to the room when she progressed 2+ station in the second stage of labor.  She pushed for 5/min.  She delivered a viable infant, cephalic and restituted to the LOA position over an intact perineum.  A nuchal cord   was not identified. The baby was placed on maternal abdomen while initial step of NRP were perfmored (Dry, Stimulated, and warmed). Hat placed on baby for thermoregulation. Delayed cord clamping was performed for 2 minutes.  Cord double clamped and cut.  Cord cut by father. Apgar scores were 9 and 9. Prophylactic Pitocin was started in the third stage of labor for active management. The placenta delivered spontaneously, shultz, with a 3 vessel cord and was sent to pathology.  Inspection revealed 1st degree and labial. An examination of the vaginal vault and cervix was free from lacerations. The uterus was firm, bleeding stable.   Umbilical artery blood gas were not sent.  There were no complications during the procedure.  Mom and baby skin to skin following delivery. Left in stable condition.  Maternal Info: Anesthesia: Epidural Episiotomy: no Lacerations:  1st degree and left labial hemostatic no repiar required Suture Repair: no Est. Blood Loss (mL):   Newborn Info:  Baby Sex: female Circumcision: N/A  APGAR (1 MIN):   APGAR (5 MINS):   APGAR (10 MINS):     Mom to postpartum.  Baby to Couplet care / Skin to Skin.   [place], [place], NP-C 09/02/20 2:28  AM

## 2020-02-17 LAB — OB RESULTS CONSOLE GC/CHLAMYDIA
Chlamydia: NEGATIVE
Gonorrhea: NEGATIVE

## 2020-02-17 LAB — OB RESULTS CONSOLE RUBELLA ANTIBODY, IGM: Rubella: IMMUNE

## 2020-02-17 LAB — OB RESULTS CONSOLE ABO/RH: RH Type: POSITIVE

## 2020-02-17 LAB — OB RESULTS CONSOLE HEPATITIS B SURFACE ANTIGEN: Hepatitis B Surface Ag: NEGATIVE

## 2020-02-17 LAB — OB RESULTS CONSOLE HIV ANTIBODY (ROUTINE TESTING): HIV: NONREACTIVE

## 2020-02-17 LAB — OB RESULTS CONSOLE ANTIBODY SCREEN: Antibody Screen: NEGATIVE

## 2020-02-17 LAB — OB RESULTS CONSOLE RPR: RPR: NONREACTIVE

## 2020-08-09 ENCOUNTER — Other Ambulatory Visit: Payer: Self-pay | Admitting: Obstetrics and Gynecology

## 2020-08-23 ENCOUNTER — Telehealth (HOSPITAL_COMMUNITY): Payer: Self-pay | Admitting: *Deleted

## 2020-08-23 NOTE — Telephone Encounter (Signed)
Preadmission screen  

## 2020-08-24 ENCOUNTER — Other Ambulatory Visit: Payer: Self-pay | Admitting: Advanced Practice Midwife

## 2020-08-25 ENCOUNTER — Encounter (HOSPITAL_COMMUNITY): Payer: Self-pay | Admitting: *Deleted

## 2020-08-25 ENCOUNTER — Telehealth (HOSPITAL_COMMUNITY): Payer: Self-pay | Admitting: *Deleted

## 2020-08-25 NOTE — Telephone Encounter (Signed)
Preadmission screen  

## 2020-08-28 IMAGING — CT CT HEAD W/O CM
4 series · 16 of 47 positions shown, 18 images · non-contrast
Comparison: None.

CLINICAL DATA: Right frontal headache.  Tearing from the right eye.

EXAM:
CT HEAD WITHOUT CONTRAST
TECHNIQUE: Contiguous axial images were obtained from the base of the skull
through the vertex without intravenous contrast.

[Series 3: head without · axial · non-contrast · 0.41mm/px · z∈[-63,+57]mm · 7 of 33 slices shown, 9 images]
[im 5/33  brain]
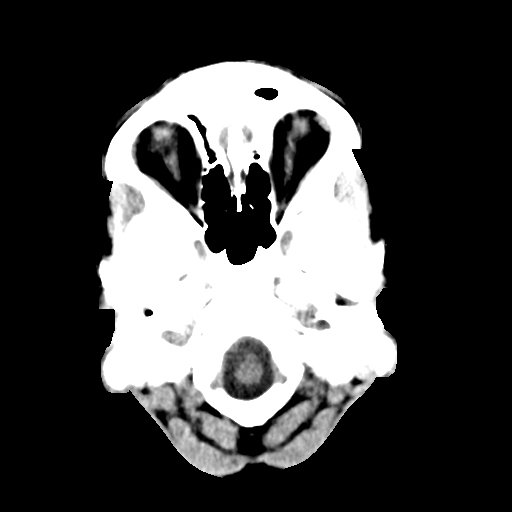
[im 5/33  bone]
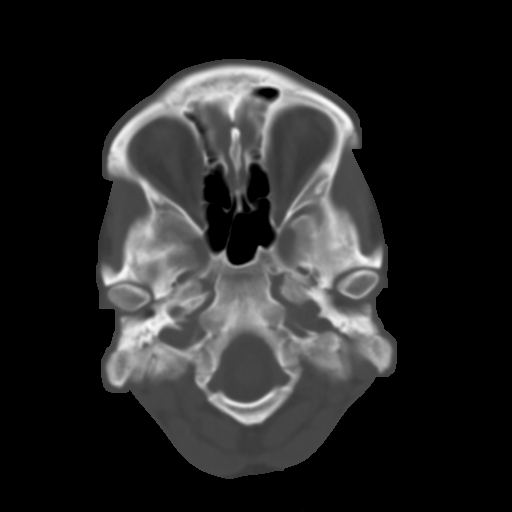
[im 9/33  brain]
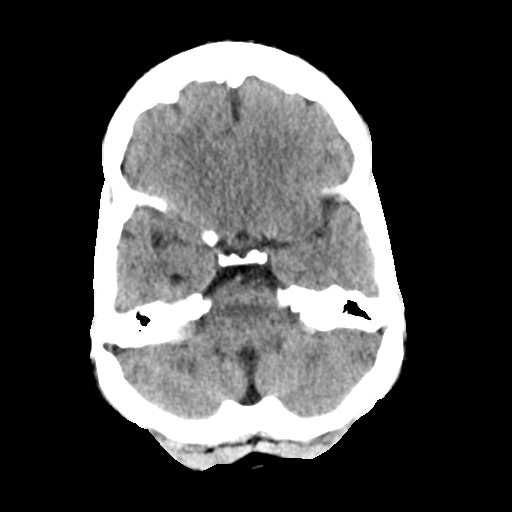
[im 13/33  brain]
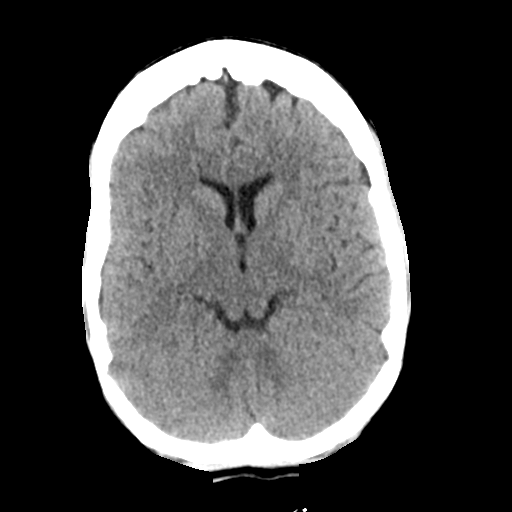
[im 17/33  brain]
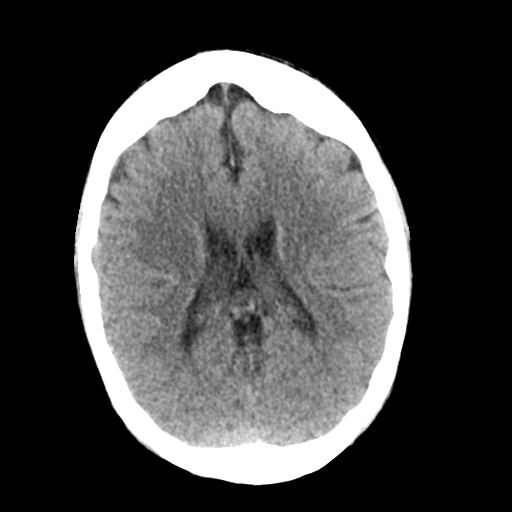
[im 21/33  brain]
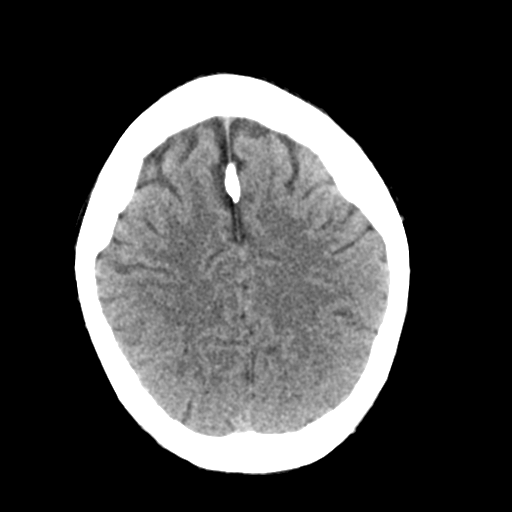
[im 21/33  bone]
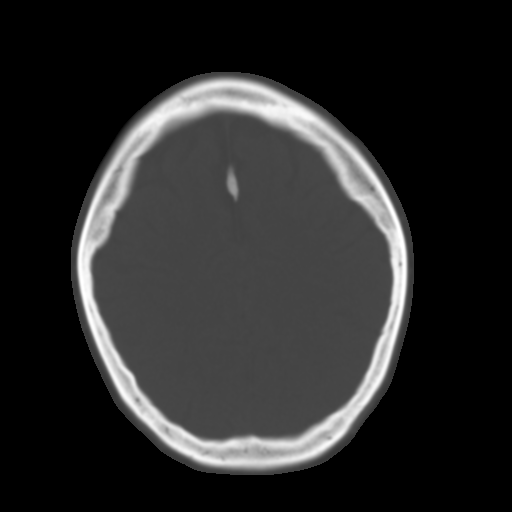
[im 25/33  brain]
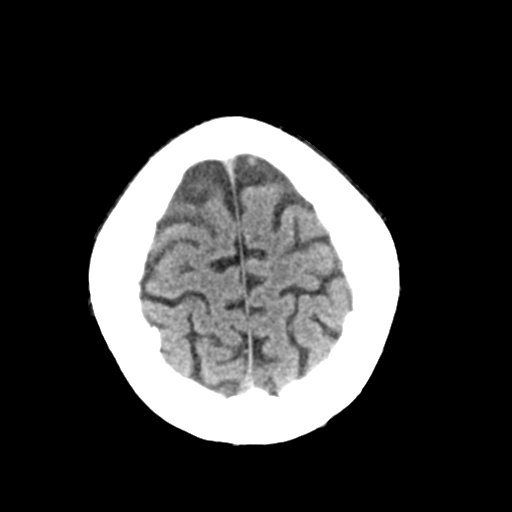
[im 29/33  brain]
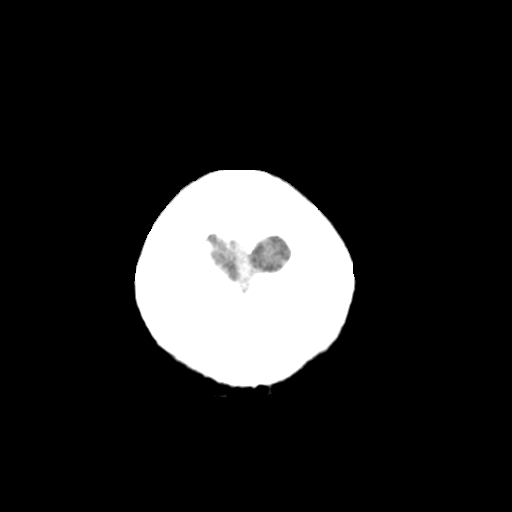

[Series 4: head bone · axial · 0.41mm/px · z∈[-67,-35]mm · 3 of 82 slices shown]
[im 9/82  bone]
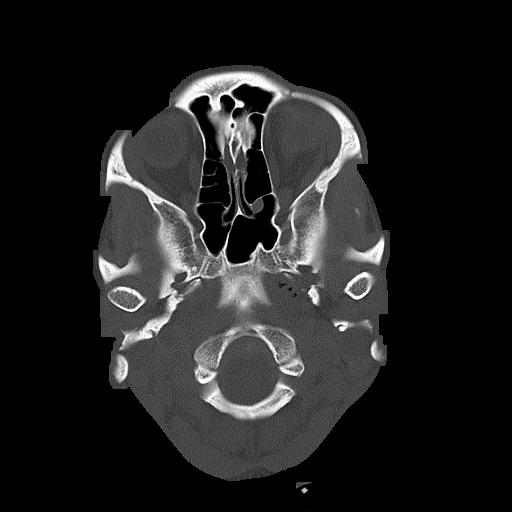
[im 17/82  bone]
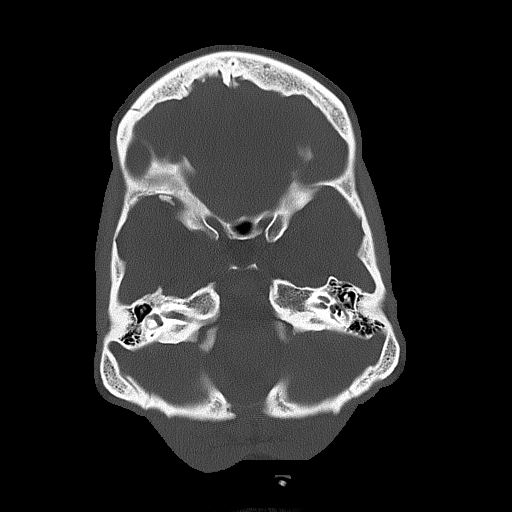
[im 25/82  bone]
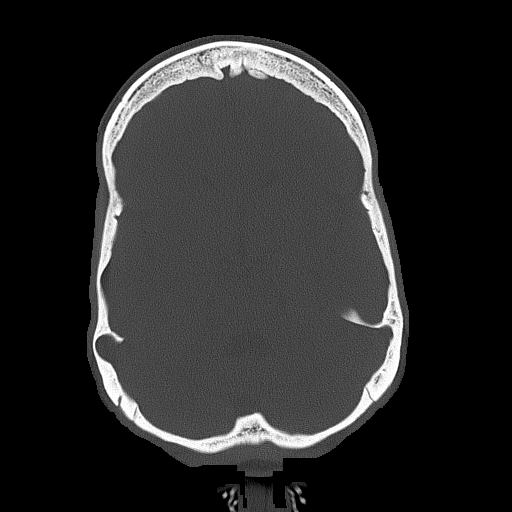

[Series 5: head without cor · coronal · non-contrast · 0.35mm/px · 3 of 66 slices shown]
[im 22/66  brain]
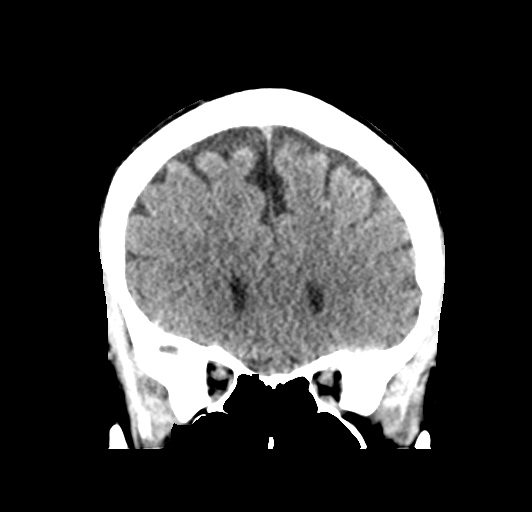
[im 29/66  brain]
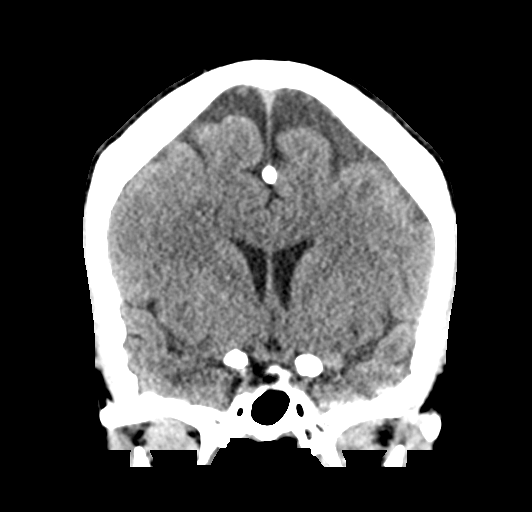
[im 37/66  brain]
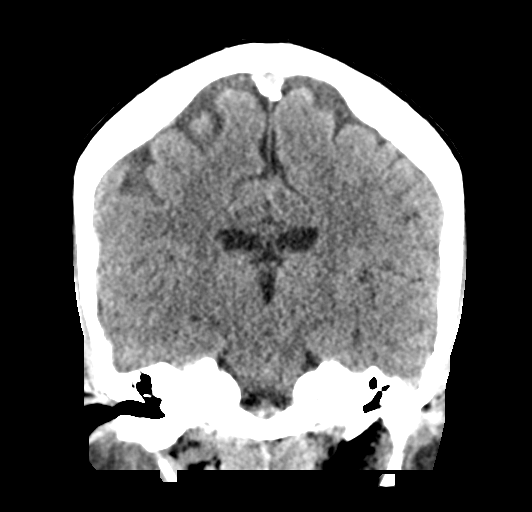

[Series 6: head without sag · sagittal · non-contrast · 0.33mm/px · 3 of 49 slices shown]
[im 17/49  brain]
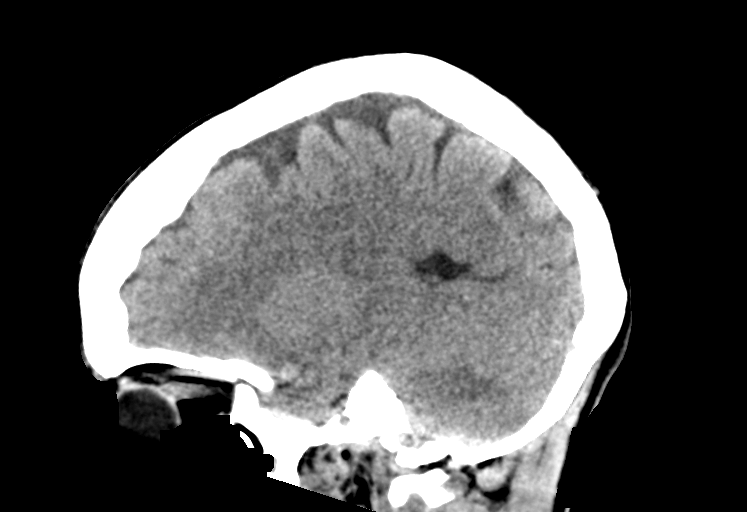
[im 25/49  brain]
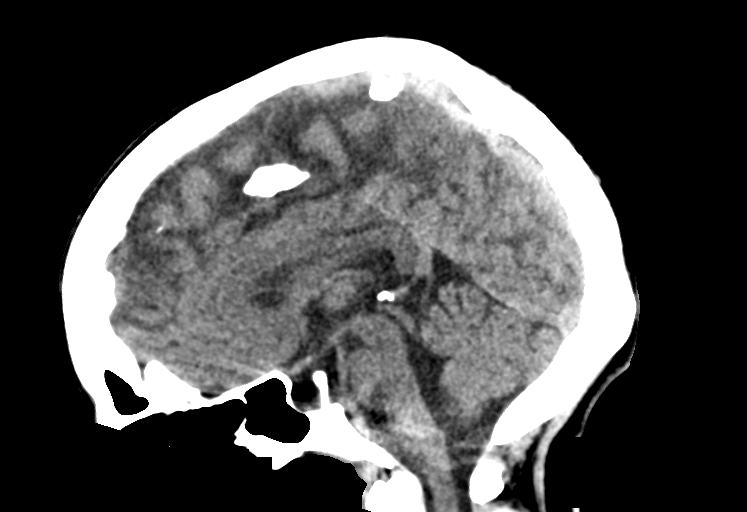
[im 33/49  brain]
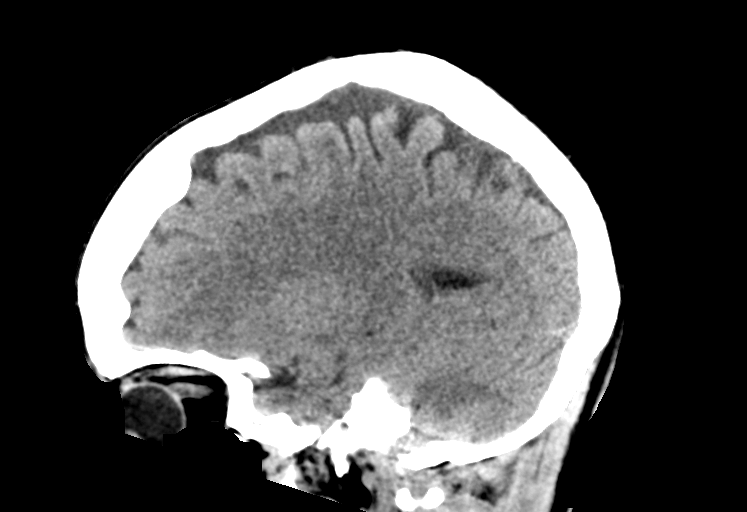

[16 of 47 positions shown; findings below may reference images not displayed]

FINDINGS: Brain: No evidence of acute infarction, hemorrhage, hydrocephalus,
extra-axial collection or mass lesion/mass effect.

Vascular: No hyperdense vessel or unexpected calcification.

Skull: Normal. Negative for fracture or focal lesion.

Sinuses/Orbits: No acute finding.

Other: None.
IMPRESSION: No acute intracranial abnormality.
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
## 2020-08-31 ENCOUNTER — Encounter (HOSPITAL_COMMUNITY): Payer: Self-pay | Admitting: Obstetrics and Gynecology

## 2020-08-31 ENCOUNTER — Inpatient Hospital Stay (HOSPITAL_COMMUNITY): Payer: No Typology Code available for payment source

## 2020-08-31 ENCOUNTER — Inpatient Hospital Stay (HOSPITAL_COMMUNITY)
Admission: AD | Admit: 2020-08-31 | Discharge: 2020-09-03 | DRG: 806 | Disposition: A | Discharge status: Home / Self Care | Payer: No Typology Code available for payment source | Attending: Obstetrics & Gynecology | Admitting: Obstetrics & Gynecology

## 2020-08-31 ENCOUNTER — Other Ambulatory Visit: Payer: Self-pay

## 2020-08-31 DIAGNOSIS — O9081 Anemia of the puerperium: Secondary | ICD-10-CM | POA: Diagnosis not present

## 2020-08-31 DIAGNOSIS — O365990 Maternal care for other known or suspected poor fetal growth, unspecified trimester, not applicable or unspecified: Secondary | ICD-10-CM | POA: Diagnosis present

## 2020-08-31 DIAGNOSIS — O365930 Maternal care for other known or suspected poor fetal growth, third trimester, not applicable or unspecified: Principal | ICD-10-CM | POA: Diagnosis present

## 2020-08-31 DIAGNOSIS — D62 Acute posthemorrhagic anemia: Secondary | ICD-10-CM | POA: Diagnosis not present

## 2020-08-31 DIAGNOSIS — Z3A37 37 weeks gestation of pregnancy: Secondary | ICD-10-CM | POA: Diagnosis not present

## 2020-08-31 DIAGNOSIS — Z20822 Contact with and (suspected) exposure to covid-19: Secondary | ICD-10-CM | POA: Diagnosis present

## 2020-08-31 LAB — CBC
HCT: 31.1 % — ABNORMAL LOW (ref 36.0–46.0)
Hemoglobin: 10.2 g/dL — ABNORMAL LOW (ref 12.0–15.0)
MCH: 28.8 pg (ref 26.0–34.0)
MCHC: 32.8 g/dL (ref 30.0–36.0)
MCV: 87.9 fL (ref 80.0–100.0)
Platelets: 271 10*3/uL (ref 150–400)
RBC: 3.54 MIL/uL — ABNORMAL LOW (ref 3.87–5.11)
RDW: 12.9 % (ref 11.5–15.5)
WBC: 9 10*3/uL (ref 4.0–10.5)
nRBC: 0 % (ref 0.0–0.2)

## 2020-08-31 LAB — TYPE AND SCREEN
ABO/RH(D): O POS
Antibody Screen: NEGATIVE

## 2020-08-31 LAB — RESP PANEL BY RT-PCR (FLU A&B, COVID) ARPGX2
Influenza A by PCR: NEGATIVE
Influenza B by PCR: NEGATIVE
SARS Coronavirus 2 by RT PCR: NEGATIVE

## 2020-08-31 LAB — OB RESULTS CONSOLE GBS: GBS: NEGATIVE

## 2020-08-31 MED ORDER — ONDANSETRON HCL 4 MG/2ML IJ SOLN: 4.0000 mg | Freq: Four times a day (QID) | INTRAMUSCULAR | Status: DC | PRN

## 2020-08-31 MED ORDER — OXYTOCIN-SODIUM CHLORIDE 30-0.9 UT/500ML-% IV SOLN
2.5000 [IU]/h | INTRAVENOUS | Status: DC
  Filled 2020-08-31: qty 500

## 2020-08-31 MED ORDER — ACETAMINOPHEN 325 MG PO TABS: 650.0000 mg | ORAL_TABLET | ORAL | Status: DC | PRN

## 2020-08-31 MED ORDER — MISOPROSTOL 25 MCG QUARTER TABLET
25.0000 ug | ORAL_TABLET | ORAL | Status: DC | PRN
  Administered 2020-08-31 (×2): 25 ug via VAGINAL
  Filled 2020-08-31 (×2): qty 1

## 2020-08-31 MED ORDER — LACTATED RINGERS IV SOLN
500.0000 mL | INTRAVENOUS | Status: DC | PRN
  Administered 2020-09-01: 500 mL via INTRAVENOUS

## 2020-08-31 MED ORDER — LIDOCAINE HCL (PF) 1 % IJ SOLN: 30.0000 mL | INTRAMUSCULAR | Status: DC | PRN

## 2020-08-31 MED ORDER — LACTATED RINGERS IV SOLN: INTRAVENOUS | Status: DC

## 2020-08-31 MED ORDER — FENTANYL CITRATE (PF) 100 MCG/2ML IJ SOLN
50.0000 ug | INTRAMUSCULAR | Status: DC | PRN
  Administered 2020-09-01: 100 ug via INTRAVENOUS
  Administered 2020-09-01 (×2): 50 ug via INTRAVENOUS
  Filled 2020-08-31 (×3): qty 2

## 2020-08-31 MED ORDER — TERBUTALINE SULFATE 1 MG/ML IJ SOLN: 0.2500 mg | Freq: Once | INTRAMUSCULAR | Status: DC | PRN

## 2020-08-31 MED ORDER — SOD CITRATE-CITRIC ACID 500-334 MG/5ML PO SOLN: 30.0000 mL | ORAL | Status: DC | PRN

## 2020-08-31 MED ORDER — OXYTOCIN BOLUS FROM INFUSION
333.0000 mL | Freq: Once | INTRAVENOUS | Status: DC
  Administered 2020-09-02: 333 mL via INTRAVENOUS

## 2020-09-01 ENCOUNTER — Inpatient Hospital Stay (HOSPITAL_COMMUNITY): Payer: No Typology Code available for payment source | Admitting: Anesthesiology

## 2020-09-01 LAB — CBC
HCT: 28.2 % — ABNORMAL LOW (ref 36.0–46.0)
Hemoglobin: 9.5 g/dL — ABNORMAL LOW (ref 12.0–15.0)
MCH: 29.5 pg (ref 26.0–34.0)
MCHC: 33.7 g/dL (ref 30.0–36.0)
MCV: 87.6 fL (ref 80.0–100.0)
Platelets: 222 10*3/uL (ref 150–400)
RBC: 3.22 MIL/uL — ABNORMAL LOW (ref 3.87–5.11)
RDW: 12.9 % (ref 11.5–15.5)
WBC: 11.4 10*3/uL — ABNORMAL HIGH (ref 4.0–10.5)
nRBC: 0 % (ref 0.0–0.2)

## 2020-09-01 LAB — RPR: RPR Ser Ql: NONREACTIVE

## 2020-09-01 MED ORDER — LACTATED RINGERS IV SOLN: 500.0000 mL | Freq: Once | INTRAVENOUS | Status: DC

## 2020-09-01 MED ORDER — TERBUTALINE SULFATE 1 MG/ML IJ SOLN: 0.2500 mg | Freq: Once | INTRAMUSCULAR | Status: DC | PRN

## 2020-09-01 MED ORDER — EPHEDRINE 5 MG/ML INJ: 10.0000 mg | INTRAVENOUS | Status: DC | PRN

## 2020-09-01 MED ORDER — EPHEDRINE 5 MG/ML INJ
10.0000 mg | INTRAVENOUS | Status: DC | PRN
Start: 2020-09-01 — End: 2020-09-02

## 2020-09-01 MED ORDER — FENTANYL-BUPIVACAINE-NACL 0.5-0.125-0.9 MG/250ML-% EP SOLN
12.0000 mL/h | EPIDURAL | Status: DC | PRN
  Administered 2020-09-01: 12 mL/h via EPIDURAL
  Filled 2020-09-01: qty 250

## 2020-09-01 MED ORDER — DIPHENHYDRAMINE HCL 50 MG/ML IJ SOLN: 12.5000 mg | INTRAMUSCULAR | Status: DC | PRN

## 2020-09-01 MED ORDER — PHENYLEPHRINE 40 MCG/ML (10ML) SYRINGE FOR IV PUSH (FOR BLOOD PRESSURE SUPPORT): 80.0000 ug | PREFILLED_SYRINGE | INTRAVENOUS | Status: DC | PRN

## 2020-09-01 MED ORDER — OXYTOCIN-SODIUM CHLORIDE 30-0.9 UT/500ML-% IV SOLN
1.0000 m[IU]/min | INTRAVENOUS | Status: DC
  Administered 2020-09-01: 1 m[IU]/min via INTRAVENOUS

## 2020-09-01 MED ORDER — LIDOCAINE HCL (PF) 1 % IJ SOLN
INTRAMUSCULAR | Status: DC | PRN
  Administered 2020-09-01: 10 mL via EPIDURAL

## 2020-09-01 NOTE — Progress Notes (Signed)
Subjective:    Reviewed birth plan and answered questions. Discussed repeating Cytotec as cervix remains thick after 2 doses. Pt agrees and denies further questions. S/O present.   Objective:    VS: BP 116/68   Pulse 90   Temp 98.6 F (37 C) (Oral)   Resp 16   Ht 5\' 1"  (1.549 m)   Wt 61.1 kg   LMP  (Exact Date)   BMI 25.43 kg/m  FHR : baseline 145 / variability moderate / accelerations present / absent decelerations Toco: irreg Membranes: intact Dilation: 1 Effacement (%): Thick Station: -3 Exam by:: [email or web address] CNM  Assessment/Plan:   25 y.o. G1P0000 [redacted]w[redacted]d IOL for IUGR    -last [redacted]w[redacted]d @ 35+2 wks vertex/ posterior placenta/ AFI 11.3/ EFW 4# 14 oz (9%) and all parameters lagging    -BPP 8/8 @ 36+2 wks  Labor:  cervical ripening Cytotec x2 doses , will require further ripening Fetal Wellbeing:  Category I Pain Control:   plans nonpharmacologic pain management I/D:   GBS neg Anticipated MOD:  NSVD  [place] MSN, CNM 09/01/2020 2:04 AM

## 2020-09-01 NOTE — Progress Notes (Signed)
Labor Progress Note  Yvette Rhodes is a 25 y.o. female, G1P0000, IUP at 37 weeks, presenting for IOL for IUGR. 7/22 [place] EFW 4.9lbs 9%, Lagging BPD, HC 4%, BPP 8/8, vertex, posterior placenta. H/O HA: Due to head trauma 01/25/2018; Neurology Evaluation-Dr. Dohmeier,: 02/12/2019-negative CT, treatment Tylenol or Ibuprofen. GBS-. LR female.  Subjective: Pt stable and comfortable post epidural placement, bleeding noted with total EBL of , medium clot noted in cervix, dark/blck in areas. No activ bleed, discussed suspicion of slight abruption, discussed AROM and IUPC placement, if fetus has descended. DR Mora Appl consulted and aware.  Patient Active Problem List   Diagnosis Date Noted   IUGR (intrauterine growth restriction) affecting care of mother 08/31/2020   Objective: BP 104/69   Pulse 76   Temp 98.3 F (36.8 C) (Oral)   Resp 16   Ht 5\' 1"  (1.549 m)   Wt 61.1 kg   LMP  (Exact Date)   SpO2 100%   BMI 25.43 kg/m  No intake/output data recorded. Total I/O In: 3155.1 [I.V.:3155.1] Out: -  NST: FHR baseline 145 bpm, Variability: moderate, Accelerations:present, Decelerations:  Absent= Cat 1/Reactive CTX:  regular, every 2 minutes, lasting 40-60 seconds Uterus gravid, soft non tender, moderate to palpate with contractions.  SVE:  Dilation: 4 Effacement (%): 70, 80 Station: -1 Exam by:: [facility], CNM Pitocin at 5 mUn/min Blood noted, greater then blood show with medium clots obtained.  AROM, clear fluids, tolerated well IUPC placed MVU 200  Assessment:  Yvette Rhodes is a 25 y.o. female, G1P0000, IUP at 37 weeks, presenting for IOL for IUGR. 7/22 8/22 EFW 4.9lbs 9%, Lagging BPD, HC 4%, BPP 8/8, vertex, posterior placenta. H/O HA: Due to head trauma 01/25/2018; Neurology Evaluation-Dr. Dohmeier,: 02/12/2019-negative CT, treatment Tylenol or Ibuprofen. GBS-. LR female. Feeling cxt, progressing in latent labor with pitocin, AROM, IUPC.  Patient Active Problem List    Diagnosis Date Noted   IUGR (intrauterine growth restriction) affecting care of mother 08/31/2020   NICHD: Category 1  Membranes: AROM, clear @ 2315 on 8/4 no s/s of infection  IUPC MVU 200s  Induction:    Cytotec x on 8/3 @ 1938, 2345  Foley Bulb: Never placed.   Pitocin - 5  Pain management:   IV pain management: x fentanyl @ 1134, 1507 on 8/4  Nitrous: PRN             Epidural placement: Placed at 2233 on 8/4  GBS Negative  Plan: Continue labor plan Continuous monitoring Rest Frequent position changes to facilitate fetal rotation and descent. Will reassess with cervical exam at 6 hours or earlier if necessary Continue pitocin per protocol 1x1 to fetal tolerance or MVU 200s Anticipate labor progression and vaginal delivery.   Md Pinn aware of plan and verbalized agreement.   10/4, NP-C, CNM, MSN 09/01/2020. 11:23 PM

## 2020-09-01 NOTE — Plan of Care (Signed)
POC discussed with patient. Patient verbalized understanding.

## 2020-09-01 NOTE — Progress Notes (Signed)
Yvette Rhodes is a 25 y.o. G1P0000 at [redacted]w[redacted]d by LMP admitted for induction of labor due to Poor fetal growth.  Subjective: Patient starting to feel more regular contractions, rates her pain at a 3-4/10. Otherwise she has no complaints.   Objective: BP 104/76   Pulse 100   Temp 98.1 F (36.7 C) (Oral)   Resp 17   Ht 5\' 1"  (1.549 m)   Wt 61.1 kg   LMP  (Exact Date)   BMI 25.43 kg/m  No intake/output data recorded.  FHT:  FHR: 140 bpm, variability: moderate,  accelerations:  Present,  decelerations:  Absent UC:   irregular, every 2-5 minutes with uterine irritability SVE:   Dilation: 1.5 Effacement (%): 60, 70 Station: -2 Exam by:: h stone rnc  Labs: Lab Results  Component Value Date   WBC 9.0 08/31/2020   HGB 10.2 (L) 08/31/2020   HCT 31.1 (L) 08/31/2020   MCV 87.9 08/31/2020   PLT 271 08/31/2020    Assessment / Plan: Induction of labor due to IUGR,  patient on low dose pitocin currently and still in  Latent labor  Labor: Progressing on Pitocin, will continue to increase then AROM Preeclampsia:  no signs or symptoms of toxicity Fetal Wellbeing:  Category I Pain Control:  Labor support without medications I/D:  n/a Anticipated MOD:  NSVD  Meshilem Machuca 09/01/2020, 11:03 AM

## 2020-09-01 NOTE — Anesthesia Procedure Notes (Signed)
Epidural Patient location during procedure: OB Start time: 09/01/2020 10:35 PM End time: 09/01/2020 10:40 PM  Staffing Anesthesiologist: Leilani Able, MD Performed: anesthesiologist   Preanesthetic Checklist Completed: patient identified, IV checked, site marked, risks and benefits discussed, surgical consent, monitors and equipment checked, pre-op evaluation and timeout performed  Epidural Patient position: sitting Prep: DuraPrep and site prepped and draped Patient monitoring: continuous pulse ox and blood pressure Approach: midline Location: L3-L4 Injection technique: LOR air  Needle:  Needle type: Tuohy  Needle gauge: 17 G Needle length: 9 cm and 9 Needle insertion depth: 6 cm Catheter type: closed end flexible Catheter size: 19 Gauge Catheter at skin depth: 11 cm Test dose: negative and Other  Assessment Events: blood not aspirated, injection not painful, no injection resistance, no paresthesia and negative IV test  Additional Notes Reason for block:procedure for pain

## 2020-09-01 NOTE — Anesthesia Preprocedure Evaluation (Signed)
Anesthesia Evaluation  Patient identified by MRN, date of birth, ID band Patient awake    Reviewed: Allergy & Precautions, H&P , NPO status , Patient's Chart, lab work & pertinent test results  Airway Mallampati: I       Dental no notable dental hx.    Pulmonary neg pulmonary ROS,    Pulmonary exam normal        Cardiovascular negative cardio ROS Normal cardiovascular exam     Neuro/Psych negative neurological ROS  negative psych ROS   GI/Hepatic negative GI ROS, Neg liver ROS,   Endo/Other  negative endocrine ROS  Renal/GU negative Renal ROS  negative genitourinary   Musculoskeletal negative musculoskeletal ROS (+)   Abdominal Normal abdominal exam  (+)   Peds  Hematology negative hematology ROS (+)   Anesthesia Other Findings   Reproductive/Obstetrics (+) Pregnancy                             Anesthesia Physical Anesthesia Plan  ASA: 2  Anesthesia Plan: Epidural   Post-op Pain Management:    Induction:   PONV Risk Score and Plan:   Airway Management Planned:   Additional Equipment:   Intra-op Plan:   Post-operative Plan:   Informed Consent: I have reviewed the patients History and Physical, chart, labs and discussed the procedure including the risks, benefits and alternatives for the proposed anesthesia with the patient or authorized representative who has indicated his/her understanding and acceptance.       Plan Discussed with:   Anesthesia Plan Comments:         Anesthesia Quick Evaluation  

## 2020-09-01 NOTE — Progress Notes (Signed)
Labor Progress Note  Yvette Rhodes is a 25 y.o. female, G1P0000, IUP at 37 weeks, presenting for IOL for IUGR. 7/22 [place] EFW 4.9lbs 9%, Lagging BPD, HC 4%, BPP 8/8, vertex, posterior placenta. H/O HA: Due to head trauma 01/25/2018; Neurology Evaluation-Dr. Dohmeier,: 02/12/2019-negative CT, treatment Tylenol or Ibuprofen. GBS-. LR female.  Subjective: Pt stable in bed with supportive mother and partner at bedside. Pt has been using IV pain meds for pain management, pitocin was stopped due to tachysystole and restart today 5pm, pt stable now, feeling cxt, will request epidural once in pain.  Patient Active Problem List   Diagnosis Date Noted   IUGR (intrauterine growth restriction) affecting care of mother 08/31/2020   Objective: BP 112/70   Pulse 79   Temp 98.3 F (36.8 C) (Oral)   Resp 18   Ht 5\' 1"  (1.549 m)   Wt 61.1 kg   LMP  (Exact Date)   BMI 25.43 kg/m  No intake/output data recorded. Total I/O In: 2899.9 [I.V.:2899.9] Out: -  NST: FHR baseline 145 bpm, Variability: moderate, Accelerations:present, Decelerations:  Absent= Cat 1/Reactive CTX:  regular, every 3-4 minutes, lasting 40-60 seconds Uterus gravid, soft non tender, moderate to palpate with contractions.  SVE:  Dilation: 3 Effacement (%): 70 Station: -2 Exam by:: Hollynn Garno, CNM Pitocin at 4 mUn/min  Assessment:  Yvette Rhodes is a 25 y.o. female, G1P0000, IUP at 37 weeks, presenting for IOL for IUGR. 7/22 8/22 EFW 4.9lbs 9%, Lagging BPD, HC 4%, BPP 8/8, vertex, posterior placenta. H/O HA: Due to head trauma 01/25/2018; Neurology Evaluation-Dr. Dohmeier,: 02/12/2019-negative CT, treatment Tylenol or Ibuprofen. GBS-. LR female. Feeling cxt, progressing in latent labor with pitocin.  Patient Active Problem List   Diagnosis Date Noted   IUGR (intrauterine growth restriction) affecting care of mother 08/31/2020   NICHD: Category 1  Membranes:  Intact, no s/s of infection  Induction:    Cytotec x on 8/3 @ 1938,  2345  Foley Bulb: Never placed.   Pitocin - 4  Pain management:   IV pain management: x fentanyl @ 1134, 1507 on 8/4  Nitrous: PRN             Epidural placement: PRN  GBS Negative  Plan: Continue labor plan Continuous monitoring Rest Ambulate Frequent position changes to facilitate fetal rotation and descent. Will reassess with cervical exam at 6 hours or earlier if necessary Continue pitocin per protocol 1x1 Anticipate AROM once fetus in pelvis.  Anticipate labor progression and vaginal delivery.   Md Pinn aware of plan and verbalized agreement.   10/4, NP-C, CNM, MSN 09/01/2020. 9:10 PM

## 2020-09-02 ENCOUNTER — Encounter (HOSPITAL_COMMUNITY): Payer: Self-pay | Admitting: Obstetrics and Gynecology

## 2020-09-02 DIAGNOSIS — D62 Acute posthemorrhagic anemia: Secondary | ICD-10-CM | POA: Diagnosis not present

## 2020-09-02 LAB — CBC
HCT: 27.6 % — ABNORMAL LOW (ref 36.0–46.0)
Hemoglobin: 8.9 g/dL — ABNORMAL LOW (ref 12.0–15.0)
MCH: 28.6 pg (ref 26.0–34.0)
MCHC: 32.2 g/dL (ref 30.0–36.0)
MCV: 88.7 fL (ref 80.0–100.0)
Platelets: 216 10*3/uL (ref 150–400)
RBC: 3.11 MIL/uL — ABNORMAL LOW (ref 3.87–5.11)
RDW: 12.8 % (ref 11.5–15.5)
WBC: 13.5 10*3/uL — ABNORMAL HIGH (ref 4.0–10.5)
nRBC: 0 % (ref 0.0–0.2)

## 2020-09-02 MED ORDER — IBUPROFEN 600 MG PO TABS
600.0000 mg | ORAL_TABLET | Freq: Four times a day (QID) | ORAL | Status: DC
  Administered 2020-09-02 – 2020-09-03 (×6): 600 mg via ORAL
  Filled 2020-09-02 (×7): qty 1

## 2020-09-02 MED ORDER — ZOLPIDEM TARTRATE 5 MG PO TABS: 5.0000 mg | ORAL_TABLET | Freq: Every evening | ORAL | Status: DC | PRN

## 2020-09-02 MED ORDER — SIMETHICONE 80 MG PO CHEW: 80.0000 mg | CHEWABLE_TABLET | ORAL | Status: DC | PRN

## 2020-09-02 MED ORDER — BENZOCAINE-MENTHOL 20-0.5 % EX AERO: 1.0000 "application " | INHALATION_SPRAY | CUTANEOUS | Status: DC | PRN

## 2020-09-02 MED ORDER — POLYSACCHARIDE IRON COMPLEX 150 MG PO CAPS
150.0000 mg | ORAL_CAPSULE | Freq: Every day | ORAL | Status: DC
  Administered 2020-09-02 – 2020-09-03 (×2): 150 mg via ORAL
  Filled 2020-09-02 (×2): qty 1

## 2020-09-02 MED ORDER — PRENATAL MULTIVITAMIN CH
1.0000 | ORAL_TABLET | Freq: Every day | ORAL | Status: DC
  Administered 2020-09-02 – 2020-09-03 (×2): 1 via ORAL
  Filled 2020-09-02 (×2): qty 1

## 2020-09-02 MED ORDER — TETANUS-DIPHTH-ACELL PERTUSSIS 5-2.5-18.5 LF-MCG/0.5 IM SUSY: 0.5000 mL | PREFILLED_SYRINGE | Freq: Once | INTRAMUSCULAR | Status: DC

## 2020-09-02 MED ORDER — ONDANSETRON HCL 4 MG PO TABS: 4.0000 mg | ORAL_TABLET | ORAL | Status: DC | PRN

## 2020-09-02 MED ORDER — FERROUS SULFATE 325 (65 FE) MG PO TABS
325.0000 mg | ORAL_TABLET | Freq: Two times a day (BID) | ORAL | Status: DC
  Administered 2020-09-02 (×2): 325 mg via ORAL
  Filled 2020-09-02 (×2): qty 1

## 2020-09-02 MED ORDER — DIPHENHYDRAMINE HCL 25 MG PO CAPS: 25.0000 mg | ORAL_CAPSULE | Freq: Four times a day (QID) | ORAL | Status: DC | PRN

## 2020-09-02 MED ORDER — WITCH HAZEL-GLYCERIN EX PADS: 1.0000 "application " | MEDICATED_PAD | CUTANEOUS | Status: DC | PRN

## 2020-09-02 MED ORDER — ONDANSETRON HCL 4 MG/2ML IJ SOLN: 4.0000 mg | INTRAMUSCULAR | Status: DC | PRN

## 2020-09-02 MED ORDER — ACETAMINOPHEN 325 MG PO TABS: 650.0000 mg | ORAL_TABLET | ORAL | Status: DC | PRN

## 2020-09-02 MED ORDER — DIBUCAINE (PERIANAL) 1 % EX OINT: 1.0000 "application " | TOPICAL_OINTMENT | CUTANEOUS | Status: DC | PRN

## 2020-09-02 MED ORDER — COCONUT OIL OIL: 1.0000 "application " | TOPICAL_OIL | Status: DC | PRN

## 2020-09-02 MED ORDER — SENNOSIDES-DOCUSATE SODIUM 8.6-50 MG PO TABS
2.0000 | ORAL_TABLET | Freq: Every day | ORAL | Status: DC
  Administered 2020-09-03: 2 via ORAL
  Filled 2020-09-02: qty 2

## 2020-09-02 NOTE — Anesthesia Postprocedure Evaluation (Signed)
Anesthesia Post Note  Patient: Yvette Rhodes  Procedure(s) Performed: AN AD HOC LABOR EPIDURAL     Patient location during evaluation: Mother Baby Anesthesia Type: Epidural Level of consciousness: awake and alert, oriented and patient cooperative Pain management: pain level controlled Vital Signs Assessment: post-procedure vital signs reviewed and stable Respiratory status: spontaneous breathing Cardiovascular status: stable Postop Assessment: no headache, epidural receding, patient able to bend at knees and no signs of nausea or vomiting Anesthetic complications: no Comments: Pt. States she is walking.  Pain score 0.    No notable events documented.  Last Vitals:  Vitals:   09/02/20 0530 09/02/20 1000  BP: 103/73 110/72  Pulse: 93 85  Resp: 18 18  Temp: 36.7 C 36.8 C  SpO2: 100% 100%    Last Pain:  Vitals:   09/02/20 1000  TempSrc: Oral  PainSc: 0-No pain   Pain Goal:                Epidural/Spinal Function Cutaneous sensation: Normal sensation (09/02/20 1000), Patient able to flex knees: Yes (09/02/20 1000), Patient able to lift hips off bed: Yes (09/02/20 1000), Back pain beyond tenderness at insertion site: No (09/02/20 1000), Progressively worsening motor and/or sensory loss: No (09/02/20 1000), Bowel and/or bladder incontinence post epidural: No (09/02/20 1000)  [facility]

## 2020-09-02 NOTE — Lactation Note (Signed)
This note was copied from a baby's chart. Lactation Consultation Note  Patient Name: Yvette Rhodes [id number] Date: 09/02/2020 Reason for consult: L&D Initial assessment;Primapara;Early term 37-38.6wks Age:27 hours  Baby latched onto mom's great everted nipples. Mom denies painful latch. Appears shallow d/t can see baby's mouth. Encouraged to hold baby closer in order for baby to get deeper latch. Baby opens mouth really wide.  Baby looks small . Doesn't have a lot of brown fat. Facial features of a preemie.  Newborn feeding habits reviewed some.  Will f/u on MBU.  Maternal Data Has patient been taught Hand Expression?: Yes Does the patient have breastfeeding experience prior to this delivery?: No  Feeding    LATCH Score Latch: Grasps breast easily, tongue down, lips flanged, rhythmical sucking.  Audible Swallowing: None  Type of Nipple: Everted at rest and after stimulation  Comfort (Breast/Nipple): Soft / non-tender  Hold (Positioning): Assistance needed to correctly position infant at breast and maintain latch.  LATCH Score: 7   Lactation Tools Discussed/Used    Interventions Interventions: Breast feeding basics reviewed;Adjust position;Assisted with latch;Support pillows;Skin to skin;Breast massage;Hand express;Breast compression  Discharge    Consult Status Consult Status: Follow-up Date: 09/02/20 Follow-up type: In-patient    Yvette Rhodes, Diamond Nickel 09/02/2020, 3:05 AM

## 2020-09-02 NOTE — Lactation Note (Addendum)
This note was copied from a baby's chart. Lactation Consultation Note  Patient Name: Yvette Rhodes [id number] Date: 09/02/2020 Reason for consult: Initial assessment;Early term 37-38.6wks;Infant < 6lbs;Primapara;1st time breastfeeding Age:27 hours   Yvette Rhodes mother whose infant is now 71 hours old.  This is an ETI at 37+2 weeks weighing < 5 lbs.  Baby was in the nursery under the radiant warmer for temperature instability but is now back rooming in with family.  Baby was swaddled and asleep in mother's arms when I arrived.  Reviewed LPTI policy with mother (father had gone off campus to get breakfast for mother).  Discussed feeding plan and options with mother.  Offered to initiate the DEBP to help with breast stimulation and obtaining colostrum to feed back to baby.  Mother willing to pump.   Pump parts, assembly and cleaning reviewed.  Taught hand expression and mother able to express colostrum.  Observed mother pumping for 10 minutes and she was already able to obtain a few drops of colostrum.  Praised mother for her efforts; mother surprised and excited.  She will feed back any colostrum she obtains.  Suggested she call me for the next latch assist.  Mother very receptive to all teaching.  Mom made aware of O/P services, breastfeeding support groups, community resources, and our phone # for post-discharge questions.  She has a DEBP for home use.  RN updated.   Maternal Data Has patient been taught Hand Expression?: Yes Does the patient have breastfeeding experience prior to this delivery?: No  Feeding Mother's Current Feeding Choice: Breast Milk  LATCH Score                    Lactation Tools Discussed/Used Tools: Pump;Flanges;Coconut oil Flange Size: 24;27 Breast pump type: Double-Electric Breast Pump;Manual Pump Education: Setup, frequency, and cleaning;Milk Storage Reason for Pumping: Breast stimulation and supplementation for ETI < 5 lbs Pumping frequency: Every  three hours  Interventions    Discharge Pump: DEBP;Manual;Personal  Consult Status Consult Status: Follow-up Date: 09/03/20 Follow-up type: In-patient    Francie Keeling R Hannie Shoe 09/02/2020, 9:13 AM

## 2020-09-03 MED ORDER — IBUPROFEN 600 MG PO TABS: 600.0000 mg | ORAL_TABLET | Freq: Four times a day (QID) | ORAL | 0 refills | Status: DC

## 2020-09-03 MED ORDER — ACETAMINOPHEN 325 MG PO TABS: 650.0000 mg | ORAL_TABLET | ORAL | 1 refills | Status: DC | PRN

## 2020-09-03 MED ORDER — POLYSACCHARIDE IRON COMPLEX 150 MG PO CAPS: 150.0000 mg | ORAL_CAPSULE | Freq: Every day | ORAL | 1 refills | Status: DC

## 2020-09-03 NOTE — Lactation Note (Signed)
This note was copied from a baby's chart. Lactation Consultation Note  Patient Name: Yvette Rhodes [id number] Date: 09/03/2020 Reason for consult: Follow-up assessment;1st time breastfeeding;Primapara;Early term 37-38.6wks Age:27 hours   P1 mother whose infant is now 84 hours old.  This is an ETI at 37+2 weeks weighing < 5 lbs.  Mother's current feeding preference is breast/donor breast milk.  Baby's bilirubin level is 8.1 mg/dl at 26 hours of life.  Weight loss 3%.  Mother has been breast feeding only with no supplementation until this morning.  Mother reported baby has been very sleepy. Reviewed the "ETI" and their characteristics at this age.  Discussed the importance of providing supplementation at least every three hours to help minimize weight loss and help to decrease bilirubin levels.  Baby has had 2 voids and 1 stool.  Discussed current bilirubin level with parents.  Offered to awaken baby and demonstrate feeding and burping.  Parents interested.  RN in room to provide more donor breast milk.  Emphasized the importance of increasing volumes.  Demonstrated paced bottle feeding and showed parents how to stimulate and awaken baby when she tires.  Baby awakened nicely and consumed an additional 15 mls after taking 5 mls approximately 1 hour ago.  Showed parents how to actively burp baby.  Parents admitted to being "too easy" on her and not trying to awaken her if she showed no interest.  They thought she was just tired.  Much education provided and parents are now eager to follow these guidelines and help their daughter feed better.  Praised them for their dedication and explained that she may need a few weeks before she learns to breast feed well.  Parents verbalized understanding.  Lab tech in during my visit to obtain bilirubin level and state lab screen.  Will follow up later this afternoon with the family.  Mother has a DEBP for home use.  Both parents receptive to all teaching.  RN  updated.   Maternal Data Has patient been taught Hand Expression?: Yes Does the patient have breastfeeding experience prior to this delivery?: No  Feeding Mother's Current Feeding Choice: Breast Milk and Donor Milk Nipple Type: Extra Slow Flow  LATCH Score                    Lactation Tools Discussed/Used Tools: Pump;Flanges Flange Size: 24;27 Breast pump type: Double-Electric Breast Pump;Manual Reason for Pumping: Breast stimulation for supplementation Pumping frequency: Every three hours  Interventions    Discharge Pump: DEBP;Manual  Consult Status Consult Status: Follow-up Date: 09/04/20 Follow-up type: In-patient    Rahiem Schellinger R [name] 09/03/2020, 12:15 PM

## 2020-09-03 NOTE — Discharge Summary (Signed)
Postpartum Discharge Summary      Patient Name: Yvette Rhodes DOB: 03-Aug-1995 MRN: [id number]  Date of admission: 08/31/2020 Delivery date:09/02/2020  Delivering provider: Noralyn Pick  Date of discharge: 09/03/2020  Admitting diagnosis: IUGR (intrauterine growth restriction) affecting care of mother [O36.5990] Intrauterine pregnancy: [redacted]w[redacted]d     Secondary diagnosis:  Active Problems:   IUGR (intrauterine growth restriction) affecting care of mother   SVD (spontaneous vaginal delivery)   Normal postpartum course   Acute blood loss anemia  Additional problems: na    Discharge diagnosis: Term Pregnancy Delivered                                              Post partum procedures: na Augmentation: AROM, Pitocin, and Cytotec Complications: None  Hospital course: Induction of Labor With Vaginal Delivery   25 y.o. yo G1P1001 at 106w2d was admitted to the hospital 08/31/2020 for induction of labor.  Indication for induction:  IUGR .  Patient had an uncomplicated labor course as follows: Membrane Rupture Time/Date: 11:01 PM ,09/01/2020   Delivery Method:Vaginal, Spontaneous  Episiotomy: None  Lacerations:  1st degree;Labial  Details of delivery can be found in separate delivery note.  Patient had a routine postpartum course. Patient is discharged home 09/03/20.  Newborn Data: Birth date:09/02/2020  Birth time:2:09 AM  Gender:Female  Living status:Living  Apgars:9 ,9  Weight:2155 g   Magnesium Sulfate received: No BMZ received: No Rhophylac:N/A MMR:N/A T-DaP:Given prenatally Flu: N/A Transfusion:No  Physical exam  Vitals:   09/02/20 1309 09/02/20 1706 09/02/20 1922 09/03/20 0500  BP: 115/68 118/62 106/71 105/75  Pulse: 82 85 82 85  Resp: $Remo'18 18 19 18  'BIEgi$ Temp: 98.2 F (36.8 C) 98.4 F (36.9 C) 98.2 F (36.8 C) 98.5 F (36.9 C)  TempSrc: Axillary Axillary Oral Oral  SpO2: 100% 100% 98% 100%  Weight:      Height:       General: alert, cooperative, and no  distress Lochia: appropriate Uterine Fundus: firm Incision: N/A DVT Evaluation: No evidence of DVT seen on physical exam. Negative Homan's sign. No cords or calf tenderness. No significant calf/ankle edema. Labs: Lab Results  Component Value Date   WBC 13.5 (H) 09/02/2020   HGB 8.9 (L) 09/02/2020   HCT 27.6 (L) 09/02/2020   MCV 88.7 09/02/2020   PLT 216 09/02/2020   CMP Latest Ref Rng & Units 09/28/2008  Glucose 70 - 99 mg/dL 86  BUN 6 - 23 mg/dL 7  Creatinine 0.4 - 1.2 mg/dL 0.8  Sodium 135 - 145 mEq/L 140  Potassium 3.5 - 5.1 mEq/L 3.8  Chloride 96 - 112 mEq/L 104   Edinburgh Score: Edinburgh Postnatal Depression Scale Screening Tool 09/03/2020  I have been able to laugh and see the funny side of things. 0  I have looked forward with enjoyment to things. 0  I have blamed myself unnecessarily when things went wrong. 0  I have been anxious or worried for no good reason. 0  I have felt scared or panicky for no good reason. 0  Things have been getting on top of me. 0  I have been so unhappy that I have had difficulty sleeping. 0  I have felt sad or miserable. 0  I have been so unhappy that I have been crying. 0  The thought of harming myself has occurred to me.  0  Edinburgh Postnatal Depression Scale Total 0      After visit meds:  Allergies as of 09/03/2020   No Known Allergies      Medication List     STOP taking these medications    ferrous sulfate 325 (65 FE) MG tablet   prenatal multivitamin Tabs tablet       TAKE these medications    acetaminophen 325 MG tablet Commonly known as: Tylenol Take 2 tablets (650 mg total) by mouth every 4 (four) hours as needed (for pain scale < 4).   ibuprofen 600 MG tablet Commonly known as: ADVIL Take 1 tablet (600 mg total) by mouth every 6 (six) hours. What changed:  medication strength how much to take when to take this reasons to take this   iron polysaccharides 150 MG capsule Commonly known as:  NIFEREX Take 1 capsule (150 mg total) by mouth daily.         Discharge home in stable condition Infant Feeding: Bottle and Breast Infant Disposition:home with mother Discharge instruction: per After Visit Summary and Postpartum booklet. Activity: Advance as tolerated. Pelvic rest for 6 weeks.  Diet: routine diet Anticipated Birth Control: Condoms Postpartum Appointment:6 weeks Additional Postpartum F/U:  na Future Appointments:No future appointments. Follow up Visit:  Follow-up Information     [facility] [place] Follow up in 6 week(s).   Specialty: Obstetrics and Gynecology Contact information: [address] [address] [phone]                     09/03/2020 Ike Bene, CNM

## 2020-09-06 LAB — SURGICAL PATHOLOGY

## 2020-10-25 ENCOUNTER — Ambulatory Visit
Admission: EM | Admit: 2020-10-25 | Discharge: 2020-10-25 | Disposition: A | Discharge status: Home / Self Care | Payer: No Typology Code available for payment source | Attending: Urgent Care | Admitting: Urgent Care

## 2020-10-25 ENCOUNTER — Encounter: Payer: Self-pay | Admitting: Emergency Medicine

## 2020-10-25 ENCOUNTER — Other Ambulatory Visit: Payer: Self-pay

## 2020-10-25 DIAGNOSIS — M25552 Pain in left hip: Secondary | ICD-10-CM

## 2020-10-25 DIAGNOSIS — M545 Low back pain, unspecified: Secondary | ICD-10-CM

## 2020-10-25 DIAGNOSIS — M25511 Pain in right shoulder: Secondary | ICD-10-CM

## 2020-10-25 MED ORDER — NAPROXEN 375 MG PO TABS: 375.0000 mg | ORAL_TABLET | Freq: Two times a day (BID) | ORAL | 0 refills | Status: DC

## 2020-10-25 MED ORDER — CYCLOBENZAPRINE HCL 5 MG PO TABS: 5.0000 mg | ORAL_TABLET | Freq: Every day | ORAL | 0 refills | Status: DC
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
## 2020-12-14 ENCOUNTER — Ambulatory Visit: Payer: No Typology Code available for payment source | Admitting: Podiatry

## 2020-12-14 ENCOUNTER — Other Ambulatory Visit: Payer: Self-pay

## 2020-12-14 ENCOUNTER — Ambulatory Visit (INDEPENDENT_AMBULATORY_CARE_PROVIDER_SITE_OTHER): Payer: No Typology Code available for payment source | Admitting: Podiatry

## 2020-12-14 DIAGNOSIS — B351 Tinea unguium: Secondary | ICD-10-CM | POA: Diagnosis not present

## 2020-12-14 DIAGNOSIS — L853 Xerosis cutis: Secondary | ICD-10-CM | POA: Diagnosis not present

## 2020-12-14 MED ORDER — CICLOPIROX 8 % EX SOLN: Freq: Every day | CUTANEOUS | 0 refills | Status: DC

## 2020-12-14 MED ORDER — AMMONIUM LACTATE 12 % EX LOTN: 1.0000 "application " | TOPICAL_LOTION | CUTANEOUS | 0 refills | Status: DC | PRN

## 2020-12-16 NOTE — Progress Notes (Signed)
Subjective:  Patient ID: Yvette Rhodes, female    DOB: 03/28/1995,  MRN: [id number]  Chief Complaint  Patient presents with   Nail Problem    Nail discoloration     25 y.o. female presents with the above complaint.  Patient presents with discoloration to left 1 through 5 with thickened elongated dystrophic mycotic discolored toenails x5.  Patient states that there is no pain associated with it.  She states that there is fungus in them she would like to have removed.  She would like to discuss treatment options for it.  She is currently breast-feeding.  No plan on future pregnancy at this time.  She also has secondary complaint of dry skin to the left lower extremity she wanted to discuss treatment options/lotion that is prescription for this.  She has tried over-the-counter medication none of which has helped.  She denies any other acute complaints.   Review of Systems: Negative except as noted in the HPI. Denies N/V/F/Ch.  No past medical history on file.  Current Outpatient Medications:    ammonium lactate (AMLACTIN) 12 % lotion, Apply 1 application topically as needed for dry skin., Disp: 400 g, Rfl: 0   ciclopirox (PENLAC) 8 % solution, Apply topically at bedtime. Apply over nail and surrounding skin. Apply daily over previous coat. After seven (7) days, may remove with alcohol and continue cycle., Disp: 6.6 mL, Rfl: 0   acetaminophen (TYLENOL) 325 MG tablet, Take 2 tablets (650 mg total) by mouth every 4 (four) hours as needed (for pain scale < 4)., Disp: 30 tablet, Rfl: 1   cyclobenzaprine (FLEXERIL) 5 MG tablet, Take 1 tablet (5 mg total) by mouth at bedtime., Disp: 30 tablet, Rfl: 0   ibuprofen (ADVIL) 600 MG tablet, Take 1 tablet (600 mg total) by mouth every 6 (six) hours., Disp: 30 tablet, Rfl: 0   iron polysaccharides (NIFEREX) 150 MG capsule, Take 1 capsule (150 mg total) by mouth daily., Disp: 30 capsule, Rfl: 1   JENCYCLA 0.35 MG tablet, Take 1 tablet by mouth daily.,  Disp: , Rfl:    naproxen (NAPROSYN) 375 MG tablet, Take 1 tablet (375 mg total) by mouth 2 (two) times daily with a meal., Disp: 30 tablet, Rfl: 0  Social History   Tobacco Use  Smoking Status Never  Smokeless Tobacco Never    No Known Allergies Objective:  There were no vitals filed for this visit. There is no height or weight on file to calculate BMI. Constitutional Well developed. Well nourished.  Vascular Dorsalis pedis pulses palpable bilaterally. Posterior tibial pulses palpable bilaterally. Capillary refill normal to all digits.  No cyanosis or clubbing noted. Pedal hair growth normal.  Neurologic Normal speech. Oriented to person, place, and time. Epicritic sensation to light touch grossly present bilaterally.  Dermatologic Nails thickened elongated dystrophic mycotic toenails x5 1 through 5 on the left side.  Mild pain on palpation Skin dry skin without fissuring or open wound noted to the left plantar lower extremity.  Orthopedic: Normal joint ROM without pain or crepitus bilaterally. No visible deformities. No bony tenderness.   Radiographs: None Assessment:   1. Onychomycosis due to dermatophyte   2. Xerosis of skin    Plan:  Patient was evaluated and treated and all questions answered.  Onychomycosis x5 left foot -Educated the patient on the etiology of onychomycosis and various treatment options associated with improving the fungal load.  I explained to the patient that there is 3 treatment options available to treat the  onychomycosis including topical, p.o., laser treatment.  Patient elected to undergo topical option with Penlac.  She is currently breast-feeding and therefore cannot be on Lamisil.  I discussed with her the benefit of Penlac I will asked her to apply it every single day for for 6 to 8 months to see improvement.  She states understanding will do so -Penlac was sent to the pharmacy  Xerosis left lower extremity I explained to the patient the  etiology of xerosis and various treatment options were extensively discussed.  I explained to the patient the importance of maintaining moisturization of the skin with application of over-the-counter lotion such as Eucerin or Luciderm.  Given that she has failed over-the-counter medication I believe patient would benefit from ammonium lactate.  I have asked her to apply twice a day.  She states understanding will do so   No follow-ups on file.

## 2021-01-29 NOTE — L&D Delivery Note (Signed)
Delivery Note At 10:49 AM a viable female was delivered via Vaginal, Spontaneous (Presentation: Left Occiput Anterior).  APGAR: 9, 9; weight 6 lb 9.5 oz (2990 g).   Placenta status: Spontaneous, Intact.  Cord: 3 vessels with the following complications: None.  Cord pH: n/a  Anesthesia: None Episiotomy: None Lacerations: Labial;1st degree Suture Repair: 3.0 vicryl Est. Blood Loss (mL): 150  Mom to postpartum.  Baby to Couplet care / Skin to Skin.  Purcell Nails 08/02/2021, 12:36 PM

## 2021-03-17 ENCOUNTER — Inpatient Hospital Stay (HOSPITAL_COMMUNITY)
Admission: AD | Admit: 2021-03-17 | Discharge: 2021-03-17 | Disposition: A | Discharge status: Home / Self Care | Payer: PRIVATE HEALTH INSURANCE | Attending: Obstetrics and Gynecology | Admitting: Obstetrics and Gynecology

## 2021-03-17 ENCOUNTER — Ambulatory Visit
Admission: EM | Admit: 2021-03-17 | Discharge: 2021-03-17 | Disposition: A | Discharge status: Home / Self Care | Payer: PRIVATE HEALTH INSURANCE

## 2021-03-17 ENCOUNTER — Other Ambulatory Visit: Payer: Self-pay

## 2021-03-17 ENCOUNTER — Inpatient Hospital Stay (HOSPITAL_COMMUNITY): Payer: PRIVATE HEALTH INSURANCE

## 2021-03-17 ENCOUNTER — Encounter (HOSPITAL_COMMUNITY): Payer: Self-pay | Admitting: Obstetrics and Gynecology

## 2021-03-17 DIAGNOSIS — O09892 Supervision of other high risk pregnancies, second trimester: Secondary | ICD-10-CM

## 2021-03-17 DIAGNOSIS — O26892 Other specified pregnancy related conditions, second trimester: Secondary | ICD-10-CM | POA: Diagnosis not present

## 2021-03-17 DIAGNOSIS — R109 Unspecified abdominal pain: Secondary | ICD-10-CM

## 2021-03-17 DIAGNOSIS — Z3201 Encounter for pregnancy test, result positive: Secondary | ICD-10-CM

## 2021-03-17 DIAGNOSIS — O26891 Other specified pregnancy related conditions, first trimester: Secondary | ICD-10-CM

## 2021-03-17 DIAGNOSIS — Z3A19 19 weeks gestation of pregnancy: Secondary | ICD-10-CM | POA: Diagnosis not present

## 2021-03-17 DIAGNOSIS — D509 Iron deficiency anemia, unspecified: Secondary | ICD-10-CM

## 2021-03-17 DIAGNOSIS — Z3490 Encounter for supervision of normal pregnancy, unspecified, unspecified trimester: Secondary | ICD-10-CM

## 2021-03-17 DIAGNOSIS — O99012 Anemia complicating pregnancy, second trimester: Secondary | ICD-10-CM | POA: Diagnosis not present

## 2021-03-17 LAB — OB RESULTS CONSOLE RPR: RPR: NONREACTIVE

## 2021-03-17 LAB — CBC
HCT: 30 % — ABNORMAL LOW (ref 36.0–46.0)
Hemoglobin: 9.8 g/dL — ABNORMAL LOW (ref 12.0–15.0)
MCH: 28.2 pg (ref 26.0–34.0)
MCHC: 32.7 g/dL (ref 30.0–36.0)
MCV: 86.5 fL (ref 80.0–100.0)
Platelets: 281 10*3/uL (ref 150–400)
RBC: 3.47 MIL/uL — ABNORMAL LOW (ref 3.87–5.11)
RDW: 14.8 % (ref 11.5–15.5)
WBC: 9.1 10*3/uL (ref 4.0–10.5)
nRBC: 0 % (ref 0.0–0.2)

## 2021-03-17 LAB — POCT URINALYSIS DIP (MANUAL ENTRY)
Bilirubin, UA: NEGATIVE
Blood, UA: NEGATIVE
Glucose, UA: NEGATIVE mg/dL
Nitrite, UA: NEGATIVE
Spec Grav, UA: 1.02 (ref 1.010–1.025)
Urobilinogen, UA: 0.2 E.U./dL
pH, UA: 7 (ref 5.0–8.0)

## 2021-03-17 LAB — HIV ANTIBODY (ROUTINE TESTING W REFLEX): HIV Screen 4th Generation wRfx: NONREACTIVE

## 2021-03-17 LAB — WET PREP, GENITAL
Clue Cells Wet Prep HPF POC: NONE SEEN
Sperm: NONE SEEN
Trich, Wet Prep: NONE SEEN
WBC, Wet Prep HPF POC: >=10 — AB (ref <10)
Yeast Wet Prep HPF POC: NONE SEEN

## 2021-03-17 LAB — POCT URINE PREGNANCY: Preg Test, Ur: POSITIVE — AB

## 2021-03-17 LAB — HCG, QUANTITATIVE, PREGNANCY: hCG, Beta Chain, Quant, S: 13512 m[IU]/mL — ABNORMAL HIGH (ref <5)

## 2021-03-17 NOTE — MAU Provider Note (Signed)
History     CSN: [id number]  Arrival date and time: 03/17/21 1606   None     Chief Complaint  Patient presents with   Back Pain   Abdominal Pain   Yvette Rhodes is a 26 y.o. G2P1001 at [redacted]w[redacted]d who receives care at [facility].  She presents  today for Back Pain and Abdominal Pain.  She states it is located on the left side and reports it feels like "menstrual cramps, but mine are usually dull and these are sharp."  She reports it lasts throughout the day and describes it as a "pulsating feeling."  She has not taken anything for the pain and rates the pain a 3/10. She reports she has not taken a pregnancy test at home, but has not had a period since she delivered August 5th.  She states she breastfeed for 3 months before weaning.  She reports she was and is currently taking birth control since ~ October.     OB History     Gravida  2   Para  1   Term  1   Preterm  0   AB  0   Living  1      SAB  0   IAB  0   Ectopic  0   Multiple  0   Live Births  1           No past medical history on file.  No past surgical history on file.  Family History  Problem Relation Age of Onset   Breast cancer Mother    Hypertension Mother    Hypertension Father    Colon cancer Maternal Aunt    Cerebral aneurysm Cousin     Social History   Tobacco Use   Smoking status: Never   Smokeless tobacco: Never  Vaping Use   Vaping Use: Never used  Substance Use Topics   Alcohol use: No    Alcohol/week: 0.0 standard drinks   Drug use: No    Allergies: No Known Allergies  Medications Prior to Admission  Medication Sig Dispense Refill Last Dose   acetaminophen (TYLENOL) 325 MG tablet Take 2 tablets (650 mg total) by mouth every 4 (four) hours as needed (for pain scale < 4). 30 tablet 1    ammonium lactate (AMLACTIN) 12 % lotion Apply 1 application topically as needed for dry skin. 400 g 0    ciclopirox (PENLAC) 8 % solution Apply topically at bedtime. Apply over nail and  surrounding skin. Apply daily over previous coat. After seven (7) days, may remove with alcohol and continue cycle. 6.6 mL 0    cyclobenzaprine (FLEXERIL) 5 MG tablet Take 1 tablet (5 mg total) by mouth at bedtime. 30 tablet 0    ibuprofen (ADVIL) 600 MG tablet Take 1 tablet (600 mg total) by mouth every 6 (six) hours. 30 tablet 0    iron polysaccharides (NIFEREX) 150 MG capsule Take 1 capsule (150 mg total) by mouth daily. 30 capsule 1    JENCYCLA 0.35 MG tablet Take 1 tablet by mouth daily.      LARIN FE 1/20 1-20 MG-MCG tablet Take 1 tablet by mouth daily.      naproxen (NAPROSYN) 375 MG tablet Take 1 tablet (375 mg total) by mouth 2 (two) times daily with a meal. 30 tablet 0     Review of Systems  Gastrointestinal:  Positive for abdominal pain. Negative for constipation, diarrhea, nausea and vomiting.  Genitourinary:  Negative for difficulty urinating,  dysuria, vaginal bleeding and vaginal discharge.  Musculoskeletal:  Positive for back pain (Left side).  Neurological:  Negative for dizziness, light-headedness and headaches.  Physical Exam   Pulse 93, temperature 98.2 F (36.8 C), resp. rate 18, height 5\' 1"  (1.549 m), weight 56.7 kg, not currently breastfeeding.  Physical Exam Vitals reviewed.  Constitutional:      Appearance: She is well-developed.  HENT:     Head: Normocephalic and atraumatic.  Eyes:     Conjunctiva/sclera: Conjunctivae normal.  Cardiovascular:     Rate and Rhythm: Normal rate.  Pulmonary:     Effort: Pulmonary effort is normal.  Musculoskeletal:     Cervical back: Normal range of motion.  Neurological:     Mental Status: She is alert and oriented to person, place, and time.  Psychiatric:        Mood and Affect: Mood normal.        Behavior: Behavior normal.        Thought Content: Thought content normal.    MAU Course  Procedures Results for orders placed or performed during the hospital encounter of 03/17/21 (from the past 24 hour(s))  Wet prep,  genital     Status: Abnormal   Collection Time: 03/17/21  6:02 PM   Specimen: Vaginal  Result Value Ref Range   Yeast Wet Prep HPF POC NONE SEEN NONE SEEN   Trich, Wet Prep NONE SEEN NONE SEEN   Clue Cells Wet Prep HPF POC NONE SEEN NONE SEEN   WBC, Wet Prep HPF POC >=10 (A) <10   Sperm NONE SEEN   HIV Antibody (routine testing w rflx)     Status: None   Collection Time: 03/17/21  6:21 PM  Result Value Ref Range   HIV Screen 4th Generation wRfx Non Reactive Non Reactive  CBC     Status: Abnormal   Collection Time: 03/17/21  6:21 PM  Result Value Ref Range   WBC 9.1 4.0 - 10.5 K/uL   RBC 3.47 (L) 3.87 - 5.11 MIL/uL   Hemoglobin 9.8 (L) 12.0 - 15.0 g/dL   HCT 07.3 (L) 71.0 - 62.6 %   MCV 86.5 80.0 - 100.0 fL   MCH 28.2 26.0 - 34.0 pg   MCHC 32.7 30.0 - 36.0 g/dL   RDW 94.8 54.6 - 27.0 %   Platelets 281 150 - 400 K/uL   nRBC 0.0 0.0 - 0.2 %  hCG, quantitative, pregnancy     Status: Abnormal   Collection Time: 03/17/21  6:21 PM  Result Value Ref Range   hCG, Beta Chain, Quant, S 13,512 (H) <5 mIU/mL    MDM Pelvic Exam; Wet Prep and GC/CT Labs: UA, UPT, CBC, hCG, ABO Ultrasound Assessment and Plan  26 year old, G2P1001 SIUP at 19.6 weeks Abdominal Pain Back Pain  -Report received from Carloyn Jaeger, CNM -Provider to bedside to discuss results with patient.  -Informed of anemia and need to take iron supplement. -Reviewed US findings and advanced GA. -Patient questions if TAB still an option and informed that provider is unsure. -Patient verbalizes understanding and without further questions. -Patient offered and accepts ultrasound picture. -Instructed to follow up with primary office. -Informed that abdominal pain and cramping is likely due to pregnancy and short interval since last one. -Discussed usage of tylenol and heating pad as needed. -Patient verbalizes understanding. -Congratulations given. -Encouraged to call primary office or return to MAU if symptoms worsen or  with the onset of new symptoms. -Discharged to home in  stable condition.  [name] 03/17/2021, 8:42 PM

## 2021-03-17 NOTE — MAU Note (Signed)
C/o lower back pain and abd  on and off since Wed. Went to urgent care and they tole her she was pregnant. Pain in more on left side and having some sharp pain on and off as well.  Denies any vag bleeding or discharge.  Pt had baby 6 months ago  stopped breastfeeding 3 months ago but taking BCP and has not had a period since having the baby.

## 2021-03-17 NOTE — ED Triage Notes (Signed)
2 day h/o LLQ abdominal pain and low back pain. Pt reports the pain feels similar to menstrual cramps but sharper. Confirms one episode on diarrhea. No nausea or emesis. No meds taken. No urinary sxs. Pt had a baby approx 12mo ago.

## 2021-03-17 NOTE — ED Provider Notes (Signed)
Patient here today for abdominal and back pain- pregnancy test positive- recommended further evaluation in the ED as she will need ultrasound to rule out ectopic pregnancy. Patient agrees with plan.    Tomi Bamberger, PA-C 03/17/21 1540

## 2021-03-18 ENCOUNTER — Ambulatory Visit: Payer: No Typology Code available for payment source

## 2021-03-20 LAB — GC/CHLAMYDIA PROBE AMP (~~LOC~~) NOT AT ARMC
Chlamydia: NEGATIVE
Comment: NEGATIVE
Comment: NORMAL
Neisseria Gonorrhea: NEGATIVE

## 2021-07-27 LAB — OB RESULTS CONSOLE GBS: GBS: NEGATIVE

## 2021-08-02 ENCOUNTER — Encounter (HOSPITAL_COMMUNITY): Payer: Self-pay | Admitting: Obstetrics and Gynecology

## 2021-08-02 ENCOUNTER — Inpatient Hospital Stay (HOSPITAL_COMMUNITY)
Admission: AD | Admit: 2021-08-02 | Discharge: 2021-08-03 | DRG: 806 | Disposition: A | Discharge status: Home / Self Care | Payer: PRIVATE HEALTH INSURANCE | Attending: Obstetrics and Gynecology | Admitting: Obstetrics and Gynecology

## 2021-08-02 ENCOUNTER — Other Ambulatory Visit: Payer: Self-pay

## 2021-08-02 DIAGNOSIS — D62 Acute posthemorrhagic anemia: Secondary | ICD-10-CM | POA: Diagnosis not present

## 2021-08-02 DIAGNOSIS — O9081 Anemia of the puerperium: Secondary | ICD-10-CM | POA: Diagnosis not present

## 2021-08-02 DIAGNOSIS — O26893 Other specified pregnancy related conditions, third trimester: Secondary | ICD-10-CM | POA: Diagnosis present

## 2021-08-02 DIAGNOSIS — Z3A39 39 weeks gestation of pregnancy: Secondary | ICD-10-CM | POA: Diagnosis not present

## 2021-08-02 LAB — TYPE AND SCREEN
ABO/RH(D): O POS
Antibody Screen: NEGATIVE

## 2021-08-02 LAB — CBC
HCT: 30.6 % — ABNORMAL LOW (ref 36.0–46.0)
Hemoglobin: 10.2 g/dL — ABNORMAL LOW (ref 12.0–15.0)
MCH: 28.7 pg (ref 26.0–34.0)
MCHC: 33.3 g/dL (ref 30.0–36.0)
MCV: 86.2 fL (ref 80.0–100.0)
Platelets: 210 10*3/uL (ref 150–400)
RBC: 3.55 MIL/uL — ABNORMAL LOW (ref 3.87–5.11)
RDW: 13.3 % (ref 11.5–15.5)
WBC: 9.2 10*3/uL (ref 4.0–10.5)
nRBC: 0 % (ref 0.0–0.2)

## 2021-08-02 MED ORDER — LACTATED RINGERS IV SOLN
INTRAVENOUS | Status: DC
Start: 2021-08-02 — End: 2021-08-02

## 2021-08-02 MED ORDER — TETANUS-DIPHTH-ACELL PERTUSSIS 5-2.5-18.5 LF-MCG/0.5 IM SUSY: 0.5000 mL | PREFILLED_SYRINGE | Freq: Once | INTRAMUSCULAR | Status: DC

## 2021-08-02 MED ORDER — ONDANSETRON HCL 4 MG PO TABS: 4.0000 mg | ORAL_TABLET | ORAL | Status: DC | PRN

## 2021-08-02 MED ORDER — SIMETHICONE 80 MG PO CHEW: 80.0000 mg | CHEWABLE_TABLET | ORAL | Status: DC | PRN

## 2021-08-02 MED ORDER — LACTATED RINGERS IV SOLN: 500.0000 mL | INTRAVENOUS | Status: DC | PRN

## 2021-08-02 MED ORDER — LIDOCAINE HCL (PF) 1 % IJ SOLN
30.0000 mL | INTRAMUSCULAR | Status: AC | PRN
  Administered 2021-08-02: 30 mL via SUBCUTANEOUS
  Filled 2021-08-02: qty 30

## 2021-08-02 MED ORDER — ZOLPIDEM TARTRATE 5 MG PO TABS: 5.0000 mg | ORAL_TABLET | Freq: Every evening | ORAL | Status: DC | PRN

## 2021-08-02 MED ORDER — ACETAMINOPHEN 325 MG PO TABS: 650.0000 mg | ORAL_TABLET | ORAL | Status: DC | PRN

## 2021-08-02 MED ORDER — BENZOCAINE-MENTHOL 20-0.5 % EX AERO: 1.0000 | INHALATION_SPRAY | CUTANEOUS | Status: DC | PRN

## 2021-08-02 MED ORDER — OXYCODONE-ACETAMINOPHEN 5-325 MG PO TABS: 2.0000 | ORAL_TABLET | ORAL | Status: DC | PRN

## 2021-08-02 MED ORDER — OXYTOCIN-SODIUM CHLORIDE 30-0.9 UT/500ML-% IV SOLN: 2.5000 [IU]/h | INTRAVENOUS | Status: DC

## 2021-08-02 MED ORDER — OXYTOCIN BOLUS FROM INFUSION
333.0000 mL | Freq: Once | INTRAVENOUS | Status: DC
Start: 2021-08-02 — End: 2021-08-02

## 2021-08-02 MED ORDER — SOD CITRATE-CITRIC ACID 500-334 MG/5ML PO SOLN: 30.0000 mL | ORAL | Status: DC | PRN

## 2021-08-02 MED ORDER — SENNOSIDES-DOCUSATE SODIUM 8.6-50 MG PO TABS
2.0000 | ORAL_TABLET | Freq: Every day | ORAL | Status: DC
  Administered 2021-08-03: 2 via ORAL
  Filled 2021-08-02: qty 2

## 2021-08-02 MED ORDER — DIPHENHYDRAMINE HCL 25 MG PO CAPS: 25.0000 mg | ORAL_CAPSULE | Freq: Four times a day (QID) | ORAL | Status: DC | PRN

## 2021-08-02 MED ORDER — WITCH HAZEL-GLYCERIN EX PADS: 1.0000 | MEDICATED_PAD | CUTANEOUS | Status: DC | PRN

## 2021-08-02 MED ORDER — ONDANSETRON HCL 4 MG/2ML IJ SOLN: 4.0000 mg | Freq: Four times a day (QID) | INTRAMUSCULAR | Status: DC | PRN

## 2021-08-02 MED ORDER — DIBUCAINE (PERIANAL) 1 % EX OINT: 1.0000 | TOPICAL_OINTMENT | CUTANEOUS | Status: DC | PRN

## 2021-08-02 MED ORDER — OXYCODONE-ACETAMINOPHEN 5-325 MG PO TABS: 1.0000 | ORAL_TABLET | ORAL | Status: DC | PRN

## 2021-08-02 MED ORDER — IBUPROFEN 600 MG PO TABS
600.0000 mg | ORAL_TABLET | Freq: Four times a day (QID) | ORAL | Status: DC
  Administered 2021-08-02 – 2021-08-03 (×4): 600 mg via ORAL
  Filled 2021-08-02 (×4): qty 1

## 2021-08-02 MED ORDER — OXYCODONE HCL 5 MG PO TABS: 10.0000 mg | ORAL_TABLET | ORAL | Status: DC | PRN

## 2021-08-02 MED ORDER — COCONUT OIL OIL: 1.0000 | TOPICAL_OIL | Status: DC | PRN

## 2021-08-02 MED ORDER — OXYTOCIN 10 UNIT/ML IJ SOLN
INTRAMUSCULAR | Status: AC
  Administered 2021-08-02: 10 [IU]
  Filled 2021-08-02: qty 1

## 2021-08-02 MED ORDER — PRENATAL MULTIVITAMIN CH
1.0000 | ORAL_TABLET | Freq: Every day | ORAL | Status: DC
  Administered 2021-08-03: 1 via ORAL
  Filled 2021-08-02: qty 1

## 2021-08-02 MED ORDER — ONDANSETRON HCL 4 MG/2ML IJ SOLN: 4.0000 mg | INTRAMUSCULAR | Status: DC | PRN

## 2021-08-02 MED ORDER — OXYCODONE HCL 5 MG PO TABS: 5.0000 mg | ORAL_TABLET | ORAL | Status: DC | PRN

## 2021-08-02 NOTE — Progress Notes (Signed)
Pt informed that the ultrasound is considered a limited OB ultrasound and is not intended to be a complete ultrasound exam.  Patient also informed that the ultrasound is not being completed with the intent of assessing for fetal or placental anomalies or any pelvic abnormalities.  Explained that the purpose of today's ultrasound is to assess for  presentation.  Patient acknowledges the purpose of the exam and the limitations of the study.     Confirmed vertex on BSUS in MAU  Warner Mccreedy, MD, MPH OB Fellow, Faculty Practice

## 2021-08-02 NOTE — MAU Note (Signed)
.  Yvette Rhodes is a 26 y.o. at [redacted]w[redacted]d here in MAU reporting: ctxs that are 3 minutes aprt since 0900 although ctxs began earlier this morning.  Denies VB or LOF, spotting yesterday.  Endorses +FM.  Onset of complaint: 0900 Pain score: 8/10 Vitals:   08/02/21 1015  BP: 113/79  Pulse: 94  Resp: 18  Temp: 97.6 F (36.4 C)  SpO2: 100%     FHT: 128 bpm Lab orders placed from triage:   None

## 2021-08-02 NOTE — Lactation Note (Signed)
This note was copied from a baby's chart. Lactation Consultation Note  Patient Name: Yvette Rhodes KGURK'Y Date: 08/02/2021 Reason for consult: Follow-up assessment;Term Age:28 hours  LC in to visit with P2 Mom of term baby.  Mom reports she breast/formula fed her first baby who was 37 wks and 4 lbs 7 oz at birth.  She said the pumping was too much and her milk supply dwindled as she fed baby more formula than breast.  Her goal is to breastfeed if she can, but she is open to formula feeding also.  Baby sleeping swaddled in crib.  Mom reports baby fed for 15 mins after he was born.  She was surprised how easy his latch was.  Mom has small breasts and large nipples.  Shared that a 4 lb baby may have difficulty sustaining a deep latch to her breast.  Offered to help with positioning and latching and Mom was agreeable and eager.  Baby started cueing once the blanket swaddled removed.  Assisted with cross cradle hold, Mom needing a lot of guidance to move her hand away from her nipple.  Reviewed hand expression for easy flow of colostrum.  Baby opened very widely with upper lip stimulation and assisted Mom to bring back in quickly.  Baby able to sustain a deep latch and suck with deep jaw extensions and occasional swallow.  Pillow support provided and encouraged Mom to relax her shoulder.  Encouragement given to Mom.  Her first baby is 42 months old and discussed briefly tandem nursing, making sure newborn goes to breast first.  Mom seems very happy that this baby is loving the breast.    Encouraged STS and cue based feedings, trying for a latch to the breast about every 3 hrs if baby asleep.  Mom knows she can ask for help prn.  Maternal Data Has patient been taught Hand Expression?: Yes Does the patient have breastfeeding experience prior to this delivery?: Yes How long did the patient breastfeed?: 2 months  Feeding Mother's Current Feeding Choice: Breast Milk and Formula  LATCH  Score Latch: Grasps breast easily, tongue down, lips flanged, rhythmical sucking.  Audible Swallowing: Spontaneous and intermittent  Type of Nipple: Everted at rest and after stimulation (large nipple in length)  Comfort (Breast/Nipple): Soft / non-tender  Hold (Positioning): Assistance needed to correctly position infant at breast and maintain latch.  LATCH Score: 9   Interventions Interventions: Breast feeding basics reviewed;Assisted with latch;Breast massage;Skin to skin;Hand express;Breast compression;Adjust position;Support pillows;Position options;Expressed milk;LC Services brochure  Discharge Pump: Personal (Medela DEBP)  Consult Status Consult Status: Follow-up Date: 08/03/21 Follow-up type: In-patient    Judee Clara 08/02/2021, 1:55 PM

## 2021-08-02 NOTE — H&P (Signed)
Yvette Rhodes is a 26 y.o. female presenting for labor evaluation and found to be 9cm.  Denies LOF or VB.  OB History     Gravida  2   Para  1   Term  1   Preterm  0   AB  0   Living  1      SAB  0   IAB  0   Ectopic  0   Multiple  0   Live Births  1          History reviewed. No pertinent past medical history. History reviewed. No pertinent surgical history. Family History: family history includes Breast cancer in her mother; Cerebral aneurysm in her cousin; Colon cancer in her maternal aunt; Hypertension in her father and mother. Social History:  reports that she has never smoked. She has never used smokeless tobacco. She reports that she does not drink alcohol and does not use drugs.     Maternal Diabetes: No Genetic Screening: Normal Maternal Ultrasounds/Referrals: Normal Fetal Ultrasounds or other Referrals:  None Maternal Substance Abuse:  No Significant Maternal Medications:  None Significant Maternal Lab Results:  Group B Strep negative Other Comments:  None  Review of Systems Denies F/C/N/V/D  History Dilation: Lip/rim Effacement (%): 100 Blood pressure 125/64, pulse 99, temperature 97.6 F (36.4 C), temperature source Oral, resp. rate 18, height 5\' 1"  (1.549 m), weight 57.9 kg, SpO2 100 %, not currently breastfeeding. Exam Physical Exam  Lungs CTA  CV RRR Abd gravid, NT Ext no calf tenderness  Prenatal labs: ABO, Rh: --/--/O POS (08/03 1930) Antibody: NEG (08/03 1930) Rubella:  Immune RPR: NON REACTIVE (08/03 1930)  HBsAg:   Negative HIV: Non Reactive (02/17 1821)  GBS: Negative/-- (08/03 0000)   Assessment/Plan: P1 @ 39 5/7 wks in labor.  Cat 2 tracing with LOC.   Pt delivered shortly after arrival to L&D.  7/7 08/02/2021, 11:11 AM

## 2021-08-03 ENCOUNTER — Other Ambulatory Visit (HOSPITAL_COMMUNITY): Payer: Self-pay

## 2021-08-03 LAB — CBC
HCT: 25.7 % — ABNORMAL LOW (ref 36.0–46.0)
Hemoglobin: 8.5 g/dL — ABNORMAL LOW (ref 12.0–15.0)
MCH: 28.5 pg (ref 26.0–34.0)
MCHC: 33.1 g/dL (ref 30.0–36.0)
MCV: 86.2 fL (ref 80.0–100.0)
Platelets: 214 10*3/uL (ref 150–400)
RBC: 2.98 MIL/uL — ABNORMAL LOW (ref 3.87–5.11)
RDW: 13.5 % (ref 11.5–15.5)
WBC: 10.9 10*3/uL — ABNORMAL HIGH (ref 4.0–10.5)
nRBC: 0 % (ref 0.0–0.2)

## 2021-08-03 LAB — RPR: RPR Ser Ql: NONREACTIVE

## 2021-08-03 MED ORDER — ACETAMINOPHEN 325 MG PO TABS: 650.0000 mg | ORAL_TABLET | ORAL | Status: AC | PRN

## 2021-08-03 MED ORDER — IBUPROFEN 600 MG PO TABS
600.0000 mg | ORAL_TABLET | Freq: Four times a day (QID) | ORAL | 0 refills | Status: AC
  Filled 2021-08-03: qty 30, 8d supply, fill #0

## 2021-08-03 NOTE — Discharge Summary (Signed)
Postpartum Discharge Summary  Date of Service updated 08/03/21      Patient Name: Yvette Rhodes DOB: 12-27-1995 MRN: [id number]  Date of admission: 08/02/2021 Delivery date:08/02/2021  Delivering provider: Everett Graff  Date of discharge: 08/03/2021  Admitting diagnosis: Indication for care in labor or delivery [O75.9] Term delivery with preterm labor [O60.20X0] Intrauterine pregnancy: [redacted]w[redacted]d     Secondary diagnosis:  Principal Problem:   Indication for care in labor or delivery Active Problems:   Term delivery with preterm labor  Additional problems: none    Discharge diagnosis: Term Pregnancy Delivered and anemia of pregnancy and acute blood loss anemia                                               Post partum procedures: none Augmentation: AROM Complications: None  Hospital course: Onset of Labor With Vaginal Delivery      26 y.o. yo [id number] at [redacted]w[redacted]d was admitted in Active Labor on 08/02/2021. Patient had an uncomplicated labor course as follows:  Membrane Rupture Time/Date: 10:43 AM ,08/02/2021   Delivery Method:Vaginal, Spontaneous  Episiotomy: None  Lacerations:  Labial;1st degree  Patient had an uncomplicated postpartum course.  She is ambulating, tolerating a regular diet, passing flatus, and urinating well. Patient is discharged home in stable condition on 08/04/21.  Newborn Data: Birth date:08/02/2021  Birth time:10:49 AM  Gender:Female  Living status:Living  Apgars:9 ,9  Weight:2990 g   Magnesium Sulfate received: No BMZ received: No Rhophylac:No MMR:No Transfusion:No  Physical exam  Vitals:   08/02/21 1422 08/02/21 1801 08/02/21 2100 08/03/21 0532  BP: 100/72 111/77 111/72 107/75  Pulse: 72 70 71 66  Resp: $Remo'16 18 18 18  'kEECi$ Temp: 98 F (36.7 C) 98.3 F (36.8 C) 98.3 F (36.8 C) 98.2 F (36.8 C)  TempSrc: Oral Oral Oral   SpO2:    98%  Weight:      Height:       General: alert, cooperative, and no distress Lochia: appropriate Uterine Fundus:  firm Incision: N/A DVT Evaluation: No evidence of DVT seen on physical exam. Labs: Lab Results  Component Value Date   WBC 10.9 (H) 08/03/2021   HGB 8.5 (L) 08/03/2021   HCT 25.7 (L) 08/03/2021   MCV 86.2 08/03/2021   PLT 214 08/03/2021      Latest Ref Rng & Units 09/28/2008   11:40 PM  CMP  Glucose 70 - 99 mg/dL 86   BUN 6 - 23 mg/dL 7   Creatinine 0.4 - 1.2 mg/dL 0.8   Sodium 135 - 145 mEq/L 140   Potassium 3.5 - 5.1 mEq/L 3.8   Chloride 96 - 112 mEq/L 104    Edinburgh Score:    08/02/2021    1:20 PM  Edinburgh Postnatal Depression Scale Screening Tool  I have been able to laugh and see the funny side of things. 0  I have looked forward with enjoyment to things. 0  I have blamed myself unnecessarily when things went wrong. 0  I have been anxious or worried for no good reason. 0  I have felt scared or panicky for no good reason. 0  Things have been getting on top of me. 0  I have been so unhappy that I have had difficulty sleeping. 0  I have felt sad or miserable. 0  I have been so unhappy that  I have been crying. 0  The thought of harming myself has occurred to me. 0  Edinburgh Postnatal Depression Scale Total 0      After visit meds:  Allergies as of 08/03/2021   No Known Allergies      Medication List     STOP taking these medications    iron polysaccharides 150 MG capsule Commonly known as: NIFEREX   OVER THE COUNTER MEDICATION       TAKE these medications    acetaminophen 325 MG tablet Commonly known as: Tylenol Take 2 tablets (650 mg total) by mouth every 4 (four) hours as needed (for pain scale < 4).   ferrous sulfate 324 MG Tbec Take 324 mg by mouth every other day.   ibuprofen 600 MG tablet Commonly known as: ADVIL Take 1 tablet (600 mg total) by mouth every 6 (six) hours.   PRENATAL PO Take 2 tablets by mouth daily.   Vitamin D 50 MCG (2000 UT) Caps Take 2,000 Units by mouth daily.         Discharge home in stable  condition Infant Feeding: Breast Infant Disposition:home with mother Discharge instruction: per After Visit Summary and Postpartum booklet. Activity: Advance as tolerated. Pelvic rest for 6 weeks.  Diet: routine diet Anticipated Birth Control: OCPs Postpartum Appointment:6 weeks Additional Postpartum F/U:  none Future Appointments:No future appointments. Follow up Visit:  [facility]. Schedule an appointment as soon as possible for a visit in 6 week(s).   Specialty: Obstetrics and Gynecology Contact information: [address] [place] [address] [phone]                    08/03/2021 Arrie Eastern, CNM

## 2021-08-10 ENCOUNTER — Telehealth (HOSPITAL_COMMUNITY): Payer: Self-pay

## 2021-08-10 NOTE — Telephone Encounter (Signed)
Patient did not answer phone call. Voicemail left for patient.   Marcelino Duster [facility] 08/10/2021,1753

## 2021-09-12 ENCOUNTER — Encounter: Payer: Self-pay | Admitting: Podiatry
